# Patient Record
Sex: Female | Born: 1935 | Race: White | Hispanic: No | Marital: Married | Smoking: Never smoker
Health system: Southern US, Community
[De-identification: ages and names within clinical notes are randomized; demographics above are authoritative.]

## PROBLEM LIST (undated history)

## (undated) DIAGNOSIS — G20A1 Parkinson's disease without dyskinesia, without mention of fluctuations: Secondary | ICD-10-CM

## (undated) DIAGNOSIS — F32A Depression, unspecified: Secondary | ICD-10-CM

## (undated) DIAGNOSIS — G4730 Sleep apnea, unspecified: Principal | ICD-10-CM

## (undated) DIAGNOSIS — F329 Major depressive disorder, single episode, unspecified: Secondary | ICD-10-CM

## (undated) DIAGNOSIS — C4491 Basal cell carcinoma of skin, unspecified: Secondary | ICD-10-CM

## (undated) DIAGNOSIS — R7303 Prediabetes: Secondary | ICD-10-CM

## (undated) DIAGNOSIS — E119 Type 2 diabetes mellitus without complications: Secondary | ICD-10-CM

## (undated) DIAGNOSIS — E785 Hyperlipidemia, unspecified: Secondary | ICD-10-CM

## (undated) DIAGNOSIS — G479 Sleep disorder, unspecified: Secondary | ICD-10-CM

## (undated) DIAGNOSIS — F419 Anxiety disorder, unspecified: Secondary | ICD-10-CM

## (undated) DIAGNOSIS — G4700 Insomnia, unspecified: Secondary | ICD-10-CM

## (undated) DIAGNOSIS — I10 Essential (primary) hypertension: Secondary | ICD-10-CM

## (undated) DIAGNOSIS — G20 Parkinson's disease: Secondary | ICD-10-CM

## (undated) HISTORY — DX: Basal cell carcinoma of skin, unspecified: C44.91

## (undated) HISTORY — DX: Hyperlipidemia, unspecified: E78.5

## (undated) HISTORY — DX: Anxiety disorder, unspecified: F41.9

## (undated) HISTORY — DX: Parkinson's disease: G20

## (undated) HISTORY — DX: Major depressive disorder, single episode, unspecified: F32.9

## (undated) HISTORY — DX: Sleep disorder, unspecified: G47.9

## (undated) HISTORY — DX: Insomnia, unspecified: G47.00

## (undated) HISTORY — DX: Depression, unspecified: F32.A

## (undated) HISTORY — DX: Sleep apnea, unspecified: G47.30

## (undated) HISTORY — PX: CATARACT EXTRACTION: SUR2

## (undated) HISTORY — DX: Essential (primary) hypertension: I10

## (undated) HISTORY — DX: Parkinson's disease without dyskinesia, without mention of fluctuations: G20.A1

## (undated) HISTORY — DX: Prediabetes: R73.03

---

## 2003-12-31 ENCOUNTER — Ambulatory Visit: Payer: Self-pay | Admitting: Internal Medicine

## 2005-01-31 ENCOUNTER — Ambulatory Visit: Payer: Self-pay | Admitting: Internal Medicine

## 2006-02-14 ENCOUNTER — Ambulatory Visit: Payer: Self-pay | Admitting: Internal Medicine

## 2007-02-18 ENCOUNTER — Ambulatory Visit: Payer: Self-pay | Admitting: Internal Medicine

## 2007-03-04 ENCOUNTER — Emergency Department: Payer: Self-pay | Admitting: Internal Medicine

## 2008-02-19 ENCOUNTER — Ambulatory Visit: Payer: Self-pay | Admitting: Internal Medicine

## 2008-03-17 ENCOUNTER — Ambulatory Visit: Payer: Self-pay | Admitting: Unknown Physician Specialty

## 2008-03-20 ENCOUNTER — Ambulatory Visit: Payer: Self-pay | Admitting: Unknown Physician Specialty

## 2009-03-18 ENCOUNTER — Ambulatory Visit: Payer: Self-pay | Admitting: Internal Medicine

## 2009-03-26 ENCOUNTER — Ambulatory Visit: Payer: Self-pay | Admitting: Internal Medicine

## 2009-09-13 ENCOUNTER — Ambulatory Visit: Payer: Self-pay | Admitting: Ophthalmology

## 2009-09-20 ENCOUNTER — Ambulatory Visit: Payer: Self-pay | Admitting: Ophthalmology

## 2009-09-20 ENCOUNTER — Ambulatory Visit: Payer: Self-pay | Admitting: Cardiovascular Disease

## 2009-10-22 ENCOUNTER — Ambulatory Visit: Payer: Self-pay | Admitting: Ophthalmology

## 2009-11-08 ENCOUNTER — Ambulatory Visit: Payer: Self-pay | Admitting: Ophthalmology

## 2010-03-21 ENCOUNTER — Ambulatory Visit: Payer: Self-pay | Admitting: Internal Medicine

## 2012-06-10 ENCOUNTER — Ambulatory Visit: Payer: Self-pay | Admitting: Neurology

## 2012-07-01 ENCOUNTER — Telehealth: Payer: Self-pay | Admitting: Nurse Practitioner

## 2012-07-01 NOTE — Telephone Encounter (Signed)
Called patient to get more information. Patient broke a tendon. Dr.Nundley doing day surgery July 3rd . Dr.Nundley is wanting me to take Celebrex for five days before surg. Is this ok? With binging on Celebrex for five days will I have problems with parkinson's. Patient is going off Celebrex right after her surgery.  Called patient and spoke back to her. She is going to call her PCP about Celebrex . Eber Jones has not seen her since 2012. Patient wants to get sch.. To see Dr. Hosie Poisson. Early August because of her surgery. Patient understood.

## 2012-07-01 NOTE — Telephone Encounter (Signed)
Patient is calling to tell us she's having surgery on July 25, 2012.  She needs to be cleared for surgery.  She's asking if any of her meds need to be discontinued prior to surgery?  She would like a call back asap.  [phone]

## 2012-08-23 ENCOUNTER — Encounter: Payer: Self-pay | Admitting: Neurology

## 2012-08-23 DIAGNOSIS — G4700 Insomnia, unspecified: Secondary | ICD-10-CM

## 2012-08-23 DIAGNOSIS — G479 Sleep disorder, unspecified: Secondary | ICD-10-CM

## 2012-08-23 DIAGNOSIS — I10 Essential (primary) hypertension: Secondary | ICD-10-CM

## 2012-08-26 ENCOUNTER — Ambulatory Visit (INDEPENDENT_AMBULATORY_CARE_PROVIDER_SITE_OTHER): Payer: 59 | Admitting: Neurology

## 2012-08-26 ENCOUNTER — Encounter: Payer: Self-pay | Admitting: Neurology

## 2012-08-26 VITALS — BP 110/60 | HR 64 | Ht 67.0 in | Wt 176.0 lb

## 2012-08-26 DIAGNOSIS — G20 Parkinson's disease: Secondary | ICD-10-CM

## 2012-08-26 DIAGNOSIS — K5900 Constipation, unspecified: Secondary | ICD-10-CM

## 2012-08-26 NOTE — Progress Notes (Signed)
Provider:  Dr Hosie Poisson Referring Provider: No ref. provider found Primary Care Physician:  No primary provider on file.  No chief complaint on file.   HPI:  Tiffany Ramirez is a 79 y.o. female here as a follow up of her Parkinson's disease, with her last visit being 01/2012.  Overall, clinically doing well since her last visit. Denies any acute issues and reports PD is stable. Continues on Sinemet 25/100 three times a day, currently taking at 6:30 AM, 11:30 AM, 5:30 PM. Notes continued good benefit from this medication, feels that she can move better and easier while on it. Tremor is well controlled, continues to be R>L sided. Denies any wearing off symptoms with this medicine, last the whole 5 hours. No motor fluctuations noted. Does notice a questionable amount of small dyskinesias in her left foot, described as tapping movement sensation. Reports these symptoms occur most often at nighttime and are relieved with a muscle relaxant. Denies any muscle cramping, neck pain or sialorrhea.   Reports no difficulties with sleep, no REM behavior disorder noted. No hallucinations or vivid dreams. Typically will wake up once per night to urinate. Notes some difficulty with constipation, reports she does not hydrate well or take in a lot of fiber. Does not exercise much as she should. Denies any gait instability. No trips falls or close calls. No dizziness with standing.  Was recently diagnosed with diabetes  Per Prior note of Dr Sandria Manly from Jan 2014: 79y/o woman with PD, onset of R-handed tremor around 2009 that has gotten progressively worse. Started as rest tremor in R thumb but progressed to bilat UE. Notes stiffness, swelling in legs. Denies any cogntiive issues. Worse with stress/anxiety. Notes micrographia. Tried primidone and inderal for tremor but no improvement noted.   Concerns/Questions:Review of Systems: Out of a complete 14 system review, the patient complains of only the following symptoms, and  all other reviewed systems are negative. Positive for fatigue  History   Social History  . Marital Status: Married    Spouse Name: Tiffany Ramirez    Number of Children: 2  . Years of Education: N/A   Occupational History  .      Home maker   Social History Main Topics  . Smoking status: Never Smoker   . Smokeless tobacco: Never Used  . Alcohol Use: No  . Drug Use: No  . Sexually Active: Not on file   Other Topics Concern  . Not on file   Social History Narrative   Patient lives at home with her husband Tiffany Ramirez. Patient is a homemaker.   Right handed.   Three cups of coffee daily.    Family History  Problem Relation Age of Onset  . Heart failure Mother   . Heart attack Father   . Brain cancer Sister   . Psychiatric Illness Sister   . Stroke Sister   . Diabetes Brother     Past Medical History  Diagnosis Date  . Insomnia with sleep apnea, unspecified   . Parkinson's disease   . Sleep disorder   . Other and unspecified hyperlipidemia   . Unspecified essential hypertension     Past Surgical History  Procedure Laterality Date  . Cataract extraction Bilateral     Current Outpatient Prescriptions  Medication Sig Dispense Refill  . AMLODIPINE BESYLATE PO Take by mouth daily.      Marland Kitchen aspirin 81 MG tablet Take 81 mg by mouth daily.      Marland Kitchen CALCIUM ASCORBATE PO Take  by mouth daily.      . carbidopa-levodopa (SINEMET IR) 25-100 MG per tablet Take 1 tablet by mouth 3 (three) times daily.      . clopidogrel (PLAVIX) 75 MG tablet Take 75 mg by mouth daily.      . cyclobenzaprine (FLEXERIL) 10 MG tablet Take 10 mg by mouth 3 (three) times daily as needed for muscle spasms.      . pantoprazole (PROTONIX) 20 MG tablet Take 20 mg by mouth daily.      . propranolol (INDERAL) 80 MG tablet Take 80 mg by mouth 3 (three) times daily.      Marland Kitchen SIMVASTATIN PO Take by mouth daily.       No current facility-administered medications for this visit.    Allergies as of 08/26/2012  . (No Known  Allergies)    Vitals: There were no vitals taken for this visit. Last Weight:  Wt Readings from Last 1 Encounters:  No data found for Wt   Last Height:   Ht Readings from Last 1 Encounters:  No data found for Ht     Physical exam: Exam: Gen: NAD, conversant Eyes: anicteric sclerae, moist conjunctivae HENT: Atraumatic Lungs: CTA, no wheezing, rales, rhonic                          CV: RRR, no MRG Abdomen: Soft, non-tender;  Extremities: No peripheral edema  Skin: Normal temperature, no rash,  Psych: Appropriate affect, pleasant  Neuro: MS: AA&Ox3, appropriately interactive, normal affect   Speech: fluent w/o paraphasic error  Memory: good recent and remote recall  CN: PERRL, EOMI no nystagmus, no ptosis, sensation intact to LT V1-V3 bilat, face symmetric, no weakness, hearing grossly intact, shoulder shrug 5/5 bilat,  tongue protrudes midline, no fasiculations noted.  Motor: normal bulk and tone Strength: 5/5  In all extremities  Reflexes: symmetrical, bilat downgoing toes  Sens: LT intact in all extremities  Brief Motor UPDRS  Speech: mild hypophonia  Facial Expression: Masked facies Tremor: Rest R: 1 L: 1  Action/postural R: 1 L: 0  Rigidity: RUE: 1 LUE: 0  Finger taps:   R: 2 L: 1  Open/close hands: R: 2 L: 1  Foot taps: R:1 L:1  Arising from Chair: 1, arises on own but slightly slow Gait/FOG: 1: upright, no shuffling noted, decreased arm swing bilat R>L, turns without difficulty, one step back with Pull test but catches self.    Assessment:  After physical and neurologic examination, review of laboratory studies, imaging, neurophysiology testing and pre-existing records, assessment will be reviewed on the problem list.  Plan:  Treatment plan and additional workup will be reviewed under Problem List.  [name] is a pleasant 79y/o woman with diagnosis of parkinsonism, which based on age, symptoms and progression is most  consistent with diagnosis of idiopathic-PD. Presents for return follow up and appears to be doing clinically well. Denies any acute issues, tolerating medications well. Denies any motor fluctuations or were not Sinemet. Appears to be getting full 5 hour benefit between doses. Does note difficulty with constipation. Patient does note some abnormal overnight movements of her lower extremity which she attributes to being dyskinesias. Question of fistula or dyskinesias based on the time they're occurring, and the response to a muscle relaxant. Suspect these may actually be muscle spasms related to wearing off medication.  #1 Parkinson's disease #2 constipation  -Will continue Sinemet 25 100 three times a day at current dosing  schedule of 6:30am, 11:30am, 5:30 PM -If patient develops wearing off or motor fluctuations would consider addition of Azilect and/or additional dose of Sinemet in the future -Patient counseled on importance of hydrating well in increasing fiber intake to prevent constipation, counseled this will help absorption of Sinemet  -Patient counseled on importance of exercise in the treatment of Parkinson's  A total of 25 minutes was spent with this patient. Over half the time was spent in direct face-to-face consultation with patient and her husband. We discussed the current diagnosis, proper way to take Sinemet, the importance of avoiding protein with the medication. Discussed importance of exercise and treatment of Parkinson's. All questions were fully answered

## 2012-08-26 NOTE — Patient Instructions (Addendum)
Overall you are doing fairly well but I do want to suggest a few things today:   Remember to drink plenty of fluid, eat healthy meals and do not skip any meals. Try to eat protein with a every meal and eat a healthy snack such as fruit or nuts in between meals. Try to keep a regular sleep-wake schedule and try to exercise daily, particularly in the form of walking, 20-30 minutes a day, if you can.   As far as your medications are concerned, I would like to suggest continuing your Sinemet at its current dose of three times a day. I suggest hydrating well and trying to increase your fiber intake. This will help your constipation.  Please schedule a follow up with NP Darrol Angel for 1 year, sooner if we need to. Please call us with any interim questions, concerns, problems, updates or refill requests.   My clinical assistant and will answer any of your questions and relay your messages to me and also relay most of my messages to you.   Our phone number is [phone]. We also have an after hours call service for urgent matters and there is a physician on-call for urgent questions. For any emergencies you know to call 911 or go to the nearest emergency room

## 2013-07-28 DIAGNOSIS — E782 Mixed hyperlipidemia: Secondary | ICD-10-CM | POA: Insufficient documentation

## 2013-07-28 DIAGNOSIS — E785 Hyperlipidemia, unspecified: Secondary | ICD-10-CM | POA: Insufficient documentation

## 2013-07-28 DIAGNOSIS — I7090 Unspecified atherosclerosis: Secondary | ICD-10-CM | POA: Insufficient documentation

## 2013-08-28 ENCOUNTER — Ambulatory Visit (INDEPENDENT_AMBULATORY_CARE_PROVIDER_SITE_OTHER): Payer: 59 | Admitting: Nurse Practitioner

## 2013-08-28 ENCOUNTER — Encounter: Payer: Self-pay | Admitting: Nurse Practitioner

## 2013-08-28 VITALS — BP 135/66 | HR 57 | Ht 67.0 in | Wt 164.0 lb

## 2013-08-28 DIAGNOSIS — G20 Parkinson's disease: Secondary | ICD-10-CM | POA: Insufficient documentation

## 2013-08-28 DIAGNOSIS — G20A1 Parkinson's disease without dyskinesia, without mention of fluctuations: Secondary | ICD-10-CM

## 2013-08-28 NOTE — Patient Instructions (Signed)
Continue Sinemet 3 times a day, symptoms are well controlled Stay well hydrated, moderate exercise by  Walking, several times a week Follow up yearly and when necessary

## 2013-08-28 NOTE — Progress Notes (Signed)
[facility]  PATIENT: Tiffany Ramirez DOB: [birth year]   REASON FOR VISIT:follow up for parkinson's disease   HISTORY OF PRESENT ILLNESS: Ms. Traub, 80 year old female returns for followup. She was last seen by Dr. Janann Colonel 08/26/2012 for Parkinson's disease. She remains on Sinemet 3 times a day with good benefit from the medication and no side effects. She denies that she has had any falls since last seen. She is getting little exercise. Her tremor is well controlled. She is not having wearing off symptoms or  motor fluctuations. She denies any problems with her memory. She denies any problems with speech or swallowing. She returns for reevaluation.  HISTORY: Tiffany Ramirez is a 80 y.o. female here as a follow up of her Parkinson's disease, with her last visit being 01/2012.  Overall, clinically doing well since her last visit. Denies any acute issues and reports PD is stable. Continues on Sinemet 25/100 three times a day, currently taking at 6:30 AM, 11:30 AM, 5:30 PM. Notes continued good benefit from this medication, feels that she can move better and easier while on it. Tremor is well controlled, continues to be R>L sided. Denies any wearing off symptoms with this medicine, last the whole 5 hours. No motor fluctuations noted. Does notice a questionable amount of small dyskinesias in her left foot, described as tapping movement sensation. Reports these symptoms occur most often at nighttime and are relieved with a muscle relaxant. Denies any muscle cramping, neck pain or sialorrhea.  Reports no difficulties with sleep, no REM behavior disorder noted. No hallucinations or vivid dreams. Typically will wake up once per night to urinate. Notes some difficulty with constipation, reports she does not hydrate well or take in a lot of fiber. Does not exercise much as she should. Denies any gait instability. No trips falls or close calls. No dizziness with standing. Was recently  diagnosed with diabetes  Per Prior note of Dr Erling Cruz from Jan 2014:  80y/o woman with PD, onset of R-handed tremor around 2009 that has gotten progressively worse. Started as rest tremor in R thumb but progressed to bilat UE. Notes stiffness, swelling in legs. Denies any cogntiive issues. Worse with stress/anxiety. Notes micrographia. Tried primidone and inderal for tremor but no improvement noted.    REVIEW OF SYSTEMS: Full 14 system review of systems performed and notable only for those listed, all others are neg:  Constitutional: N/A  Cardiovascular: N/A  Ear/Nose/Throat: Runny nose  Skin: N/A  Eyes: N/A  Respiratory: N/A  Gastroitestinal: N/A  Hematology/Lymphatic: N/A  Endocrine: N/A Musculoskeletal:N/A  Allergy/Immunology: N/A  Neurological: Tremors Psychiatric: N/A Sleep : NA   ALLERGIES: Allergies  Allergen Reactions  . Phenobarbital Other (See Comments)    Overall restlessness    HOME MEDICATIONS: Outpatient Prescriptions Prior to Visit  Medication Sig Dispense Refill  . aspirin 81 MG tablet Take 81 mg by mouth daily.      Marland Kitchen CALCIUM ASCORBATE PO Take 600 mg by mouth daily.       . carbidopa-levodopa (SINEMET IR) 25-100 MG per tablet Take 1 tablet by mouth 3 (three) times daily.      . clopidogrel (PLAVIX) 75 MG tablet Take 75 mg by mouth daily.      . cyclobenzaprine (FLEXERIL) 10 MG tablet Take 10 mg by mouth at bedtime as needed for muscle spasms.       . pantoprazole (PROTONIX) 20 MG tablet Take 20 mg by mouth daily.      . propranolol (  INDERAL) 80 MG tablet Take 80 mg by mouth daily.       Marland Kitchen SIMVASTATIN PO Take 40 mg by mouth daily.       Marland Kitchen AMLODIPINE BESYLATE PO Take by mouth daily.       No facility-administered medications prior to visit.    PAST MEDICAL HISTORY: Past Medical History  Diagnosis Date  . Insomnia with sleep apnea, unspecified   . Parkinson's disease   . Sleep disorder   . Other and unspecified hyperlipidemia   . Unspecified essential  hypertension     PAST SURGICAL HISTORY: Past Surgical History  Procedure Laterality Date  . Cataract extraction Bilateral     FAMILY HISTORY: Family History  Problem Relation Age of Onset  . Heart failure Mother   . Heart attack Father   . Brain cancer Sister   . Psychiatric Illness Sister   . Stroke Sister   . Diabetes Brother     SOCIAL HISTORY: History   Social History  . Marital Status: Married    Spouse Name: Joe    Number of Children: 2  . Years of Education: N/A   Occupational History  .      Home maker   Social History Main Topics  . Smoking status: Never Smoker   . Smokeless tobacco: Never Used  . Alcohol Use: No  . Drug Use: No  . Sexual Activity: Not on file   Other Topics Concern  . Not on file   Social History Narrative   Patient lives at home with her husband [name]. Patient is a homemaker.   Right handed.   Three cups of coffee daily.     PHYSICAL EXAM  Filed Vitals:   08/28/13 1331  BP: 135/66  Pulse: 57  Height: 5\' 7"  (1.702 m)  Weight: 164 lb (74.39 kg)   Body mass index is 25.68 kg/(m^2).  Generalized: Well developed, in no acute distress, mild masking of the face  Head: normocephalic and atraumatic,. Oropharynx benign  Neck: Supple, no carotid bruits  Cardiac: Regular rate rhythm, no murmur  Musculoskeletal: No deformity   Neurological examination   Mentation: Alert oriented to time, place, history taking. Follows all commands speech and language fluent, mild hypophonia  Cranial nerve II-XII: Pupils were equal round reactive to light extraocular movements were full, visual field were full on confrontational test. Facial sensation and strength were normal. hearing was intact to finger rubbing bilaterally. Uvula tongue midline. head turning and shoulder shrug were normal and symmetric.Tongue protrusion into cheek strength was normal.Negative Myerson's sign Motor: normal bulk and tone, full strength in the BUE, BLE, fine finger  movements normal, no pronator drift. No focal weakness, no resting tremor Sensory: normal and symmetric to light touch, pinprick, and  vibration  Coordination: finger-nose-finger, heel-to-shin bilaterally, no dysmetria Reflexes: Brachioradialis 2/2, biceps 2/2, triceps 2/2, patellar 2/2, Achilles 2/2, plantar responses were flexor bilaterally. Gait and Station: Rising up from seated position without assistance of arms  normal stance,  moderate stride, decreased arm swing right greater than left  smooth turning, able to perform tiptoe, and heel walking without difficulty. Tandem gait is mildly unsteady  DIAGNOSTIC DATA (LABS, IMAGING, TESTING) - ASSESSMENT AND PLAN  80 y.o. year old female  has a past medical history of Parkinson's disease; here to followup. She is a patient of Dr. Janann Colonel who is out of the office today  Continue Sinemet 3 times a day, symptoms are well controlled does not need refills Stay well hydrated, moderate  exercise by  walking, several times a week Follow up yearly and when necessary Dennie Bible, [facility], [facility], [facility] [address], [place], [place] [address] [phone]

## 2013-09-11 NOTE — Progress Notes (Signed)
I reviewed note and agree with plan.   Aleida Crandell R. Gwenn Teodoro, MD  Certified in Neurology, Neurophysiology and Neuroimaging  [facility] [address], [address] [place], [place] [address] [phone]   

## 2014-02-17 ENCOUNTER — Ambulatory Visit: Payer: Self-pay | Admitting: Internal Medicine

## 2014-08-13 DIAGNOSIS — R739 Hyperglycemia, unspecified: Secondary | ICD-10-CM | POA: Insufficient documentation

## 2014-08-31 ENCOUNTER — Ambulatory Visit: Payer: 59 | Admitting: Nurse Practitioner

## 2014-09-21 ENCOUNTER — Ambulatory Visit (INDEPENDENT_AMBULATORY_CARE_PROVIDER_SITE_OTHER): Payer: Medicare Other | Admitting: Nurse Practitioner

## 2014-09-21 ENCOUNTER — Encounter: Payer: Self-pay | Admitting: Nurse Practitioner

## 2014-09-21 VITALS — BP 130/70 | HR 56 | Ht 67.0 in | Wt 166.5 lb

## 2014-09-21 DIAGNOSIS — G20 Parkinson's disease: Secondary | ICD-10-CM

## 2014-09-21 DIAGNOSIS — G4700 Insomnia, unspecified: Secondary | ICD-10-CM | POA: Diagnosis not present

## 2014-09-21 DIAGNOSIS — G479 Sleep disorder, unspecified: Secondary | ICD-10-CM

## 2014-09-21 DIAGNOSIS — G4730 Sleep apnea, unspecified: Secondary | ICD-10-CM

## 2014-09-21 DIAGNOSIS — I10 Essential (primary) hypertension: Secondary | ICD-10-CM | POA: Diagnosis not present

## 2014-09-21 MED ORDER — CYCLOBENZAPRINE HCL 10 MG PO TABS
10.0000 mg | ORAL_TABLET | Freq: Every evening | ORAL | Status: DC | PRN
Start: 2014-09-21 — End: 2015-02-22

## 2014-09-21 NOTE — Progress Notes (Signed)
[facility]  PATIENT: Tiffany Ramirez DOB: 1928-04-30   REASON FOR VISIT:  Follow-up for Parkinson's disease, sleep disorder, insomnia HISTORY FROM: Patient    HISTORY OF PRESENT ILLNESS:Tiffany Ramirez, 81 year old female returns for followup. She has a history of  Parkinson's disease. She remains on Sinemet 3 times a day with good benefit from the medication and no side effects. She denies that she has had any falls since last seen. She is getting little exercise. Her tremor is well controlled. She is not having wearing off symptoms or motor fluctuations. She denies any problems with her memory. She denies any problems with speech or swallowing. She returns for reevaluation.  HISTORY: Tiffany Ramirez is a 81 y.o. female here as a follow up of her Parkinson's disease, with her last visit being 01/2012.  Overall, clinically doing well since her last visit. Denies any acute issues and reports PD is stable. Continues on Sinemet 25/100 three times a day, currently taking at 6:30 AM, 11:30 AM, 5:30 PM. Notes continued good benefit from this medication, feels that she can move better and easier while on it. Tremor is well controlled, continues to be R>L sided. Denies any wearing off symptoms with this medicine, last the whole 5 hours. No motor fluctuations noted. Does notice a questionable amount of small dyskinesias in her left foot, described as tapping movement sensation. Reports these symptoms occur most often at nighttime and are relieved with a muscle relaxant. Denies any muscle cramping, neck pain or sialorrhea.  Reports no difficulties with sleep, no REM behavior disorder noted. No hallucinations or vivid dreams. Typically will wake up once per night to urinate. Notes some difficulty with constipation, reports she does not hydrate well or take in a lot of fiber. Does not exercise much as she should. Denies any gait instability. No trips falls or close calls. No dizziness  with standing. Was recently diagnosed with diabetes  Per Prior note of Dr Erling Cruz from Jan 2014:  81y/o woman with PD, onset of R-handed tremor around 2009 that has gotten progressively worse. Started as rest tremor in R thumb but progressed to bilat UE. Notes stiffness, swelling in legs. Denies any cogntiive issues. Worse with stress/anxiety. Notes micrographia. Tried primidone and inderal for tremor but no improvement noted.     REVIEW OF SYSTEMS: Full 14 system review of systems performed and notable only for those listed, all others are neg:  Constitutional: neg  Cardiovascular: neg Ear/Nose/Throat: neg  Skin: neg Eyes: neg Respiratory: neg Gastroitestinal: neg  Hematology/Lymphatic: neg  Endocrine: neg Musculoskeletal:neg Allergy/Immunology: neg Neurological: Tremor Psychiatric: neg Sleep : Insomnia   ALLERGIES: Allergies  Allergen Reactions  . Phenobarbital Other (See Comments)    Overall restlessness    HOME MEDICATIONS: Outpatient Prescriptions Prior to Visit  Medication Sig Dispense Refill  . aspirin 81 MG tablet Take 81 mg by mouth daily.    Marland Kitchen CALCIUM ASCORBATE PO Take 600 mg by mouth daily.     . carbidopa-levodopa (SINEMET IR) 25-100 MG per tablet Take 1 tablet by mouth 3 (three) times daily.    . clopidogrel (PLAVIX) 75 MG tablet Take 75 mg by mouth daily.    . hydrochlorothiazide (HYDRODIURIL) 12.5 MG tablet Take 25 mg by mouth daily.    . propranolol (INDERAL) 80 MG tablet Take 80 mg by mouth daily.     Marland Kitchen SIMVASTATIN PO Take 40 mg by mouth daily.     . cyclobenzaprine (FLEXERIL) 10 MG tablet Take 10 mg by  mouth at bedtime as needed for muscle spasms.     . pantoprazole (PROTONIX) 20 MG tablet Take 40 mg by mouth daily.      No facility-administered medications prior to visit.    PAST MEDICAL HISTORY: Past Medical History  Diagnosis Date  . Insomnia with sleep apnea, unspecified   . Parkinson's disease   . Sleep disorder   . Other and unspecified  hyperlipidemia   . Unspecified essential hypertension     PAST SURGICAL HISTORY: Past Surgical History  Procedure Laterality Date  . Cataract extraction Bilateral     FAMILY HISTORY: Family History  Problem Relation Age of Onset  . Heart failure Mother   . Heart attack Father   . Brain cancer Sister   . Psychiatric Illness Sister   . Stroke Sister   . Diabetes Brother     SOCIAL HISTORY: Social History   Social History  . Marital Status: Married    Spouse Name: [name]  . Number of Children: 2  . Years of Education: N/A   Occupational History  .      Home maker   Social History Main Topics  . Smoking status: Never Smoker   . Smokeless tobacco: Never Used  . Alcohol Use: No  . Drug Use: No  . Sexual Activity: Not on file   Other Topics Concern  . Not on file   Social History Narrative   Patient lives at home with her husband [name]. Patient is a homemaker.   Right handed.   Three cups of coffee daily.     PHYSICAL EXAM  Filed Vitals:   09/21/14 1612  BP: 160/76  Pulse: 56  Height: 5\' 7"  (1.702 m)  Weight: 166 lb 8 oz (75.524 kg)   Body mass index is 26.07 kg/(m^2). Generalized: Well developed, in no acute distress, mild masking of the face  Head: normocephalic and atraumatic,. Oropharynx benign  Neck: Supple, no carotid bruits  Cardiac: Regular rate rhythm, no murmur  Musculoskeletal: No deformity   Neurological examination   Mentation: Alert oriented to time, place, history taking. Follows all commands speech and language fluent, mild hypophonia  Cranial nerve II-XII: Pupils were equal round reactive to light extraocular movements were full, visual field were full on confrontational test. Facial sensation and strength were normal. hearing was intact to finger rubbing bilaterally. Uvula tongue midline. head turning and shoulder shrug were normal and symmetric.Tongue protrusion into cheek strength was normal.Negative Myerson's sign Motor: normal bulk  and tone, full strength in the BUE, BLE, fine finger movements normal, no pronator drift. No focal weakness, no resting tremor Sensory: normal and symmetric to light touch, pinprick, and vibration  Coordination: finger-nose-finger, heel-to-shin bilaterally, no dysmetria Reflexes: Brachioradialis 2/2, biceps 2/2, triceps 2/2, patellar 2/2, Achilles 2/2, plantar responses were flexor bilaterally. Gait and Station: Rising up from seated position without assistance of arms normal stance, moderate stride, decreased arm swing right greater than left smooth turning, able to perform tiptoe, and heel walking without difficulty. Tandem gait is mildly unsteady . No assistive device  DIAGNOSTIC DATA (LABS, IMAGING, TESTING) - ASSESSMENT AND PLAN  81 y.o. year old female  has a past medical history of Insomnia with sleep apnea, unspecified; Parkinson's disease; Sleep disorder; here to follow-up for her Parkinson's disease and insomnia.  Continue Sinemet 3 times a day, symptoms are well controlled does not need refills Continue Flexeril will refill, this was ordered for her insomnia after her sleep study Stay well hydrated, moderate exercise by  walking, several times a week Follow up yearly and when necessary Tiffany Ramirez, [facility], [facility], APRN  [facility] [address], [place], [place] [address] [phone]

## 2014-09-21 NOTE — Patient Instructions (Signed)
Continue Sinemet 3 times a day, symptoms are well controlled does not need refills Continue Flexeril will refill Stay well hydrated, moderate exercise by walking, several times a week Follow up yearly and when necessary

## 2014-09-22 NOTE — Progress Notes (Signed)
I reviewed note and agree with plan.   Penni Bombard, MD 7/42/5956, 3:87 AM Certified in Neurology, Neurophysiology and Neuroimaging  [facility] [address], [place], [place] [address] [phone]

## 2014-11-16 ENCOUNTER — Telehealth: Payer: Self-pay | Admitting: Nurse Practitioner

## 2014-11-16 NOTE — Telephone Encounter (Signed)
Pt's daughter called sts pt is having problems with depression and increased shaking and PTSD. She started taking Paxil around the 2nd week of Aug 2016, depression got better but shaking got worse. She stopped taking PARoxetine (PAXIL) 10 MG tablet  After last OV 09/21/14, she thought it increased shaking.  Daughter sts pt is under a lot of stress and has noticed that with increased stress the shaking gets worse. Both pt's daughter's have come to from out of town take care of pt in an emergency situation,pt was saying she did not want to live anymore. Daughter is wanting to take her to [place] (where she lives)to take care of her. Please call daughter at [phone].

## 2014-11-16 NOTE — Telephone Encounter (Signed)
I spoke to daughter, [name] who is on [place].    Pt was started on an antidepressant, paxil and took for 2 wks but caused increase tremors.  She did speak to pcp, but due to miscommunication or ?  She decided to go off the paxil.  She is back to baseline of her PD, but she is very depressed.  Daughter is asking for recommendations , if there is any medication recommendation for an antidepressant that would be better for PD pts.  [name] will be taking both parents with her back to [place] with her for a period of time.  Since her parents will be coming back here to live (once she feels like pt is doing ok) she does not want to change MD's if at all possible.  (concerning the depression issue).   I told her that this was not our speciality, but would ask.  She has not seen a MD here since Dr. Janann Colonel (but NP has been overseen by Dr. Leta Baptist) who is assigned to pt).  They are leaving tomorrow would be willing to come in.

## 2014-11-16 NOTE — Telephone Encounter (Signed)
Since PCP has ordered this in the past I would prefer he take care of this

## 2014-11-16 NOTE — Telephone Encounter (Signed)
I called and spoke to Tiffany Ramirez again and let her know that I spoke in general to another movement disorder MD about what antidepressants would be better prescribed for PD pts.  Relayed that all of these may cause increase tremors but possible options  are celexa, effexor or lexapro.  Pcp would need to decide and prescribe.  I told her that unfortunately that I did not have any availability with MD tomorrow  but Wednesday I did.   She then stated that they would like to come and see PD specialist.  I was able to fit in with Dr. Rexene Alberts for this Wednesday at 1000 (be here 0945).  She verbalized understanding.  She had placed call to pcp and was waiting for call back.

## 2014-11-18 ENCOUNTER — Ambulatory Visit (INDEPENDENT_AMBULATORY_CARE_PROVIDER_SITE_OTHER): Payer: Medicare Other | Admitting: Neurology

## 2014-11-18 ENCOUNTER — Encounter: Payer: Self-pay | Admitting: Neurology

## 2014-11-18 VITALS — BP 110/60 | HR 72 | Resp 16 | Ht 67.0 in | Wt 168.0 lb

## 2014-11-18 DIAGNOSIS — K5900 Constipation, unspecified: Secondary | ICD-10-CM | POA: Diagnosis not present

## 2014-11-18 DIAGNOSIS — K5909 Other constipation: Secondary | ICD-10-CM

## 2014-11-18 DIAGNOSIS — F418 Other specified anxiety disorders: Secondary | ICD-10-CM

## 2014-11-18 DIAGNOSIS — G20 Parkinson's disease: Secondary | ICD-10-CM

## 2014-11-18 MED ORDER — CARBIDOPA-LEVODOPA 25-100 MG PO TABS: 1.0000 | ORAL_TABLET | Freq: Three times a day (TID) | ORAL | Status: DC

## 2014-11-18 MED ORDER — CARBIDOPA-LEVODOPA ER 50-200 MG PO TBCR: 1.0000 | EXTENDED_RELEASE_TABLET | Freq: Every day | ORAL | Status: DC

## 2014-11-18 NOTE — Patient Instructions (Addendum)
I think, from what I can tell, motor-wise your Parkinson's disease has remained fairly stable, which is reassuring. Nevertheless, as you know, this disease does progress with time. It can affect your balance, your memory, your mood, your bowel and bladder function, your posture, balance and walking and your activities of daily living. However, there are good supportive treatments and symptomatic treatments available, so most patients have a change to a good quality life and life expectancy is not typically altered. Overall you are doing fairly well but I do want to suggest a few things today:  Remember to drink plenty of fluid at least 6 glasses (8 oz each), eat healthy meals and do not skip any meals. Try to eat protein with a every meal and eat a healthy snack such as fruit or nuts in between meals. Try to keep a regular sleep-wake schedule and try to exercise daily, particularly in the form of walking, 20-30 minutes a day, if you can.   Taking your medication on schedule is key.   Try to stay active physically and mentally. Engage in social activities in your community and with your family and try to keep up with current events by reading the newspaper or watching the news. Try to do word puzzles and you may like to do puzzles and brain games on the computer such as on [email or web address]   As far as your medications are concerned, I would like to suggest that you take your current medication with the following additional changes: stop Wellbutrin, and start Effexor XR 37.5 mg. We will keep your sinemet the same, but ADD Sinemet CR 50/200 mg at bedtime, and we will stop the night time flexeril.  You can try Melatonin at night for sleep: take 3 to 5 mg one hour before your bedtime.   I would like to see you back in 3 months, sooner if we need to. Please call us with any interim questions, concerns, problems, updates or refill requests.  Our phone number is [phone]. We also have an after hours call  service for urgent matters and there is a physician on-call for urgent questions, that cannot wait till the next work day. For any emergencies you know to call 911 or go to the nearest emergency room.   You can email me through my chart and also leave a phone message for Beverlee Nims, my nurse.

## 2014-11-18 NOTE — Progress Notes (Signed)
Subjective:    Patient ID: Tiffany Ramirez is a 81 y.o. female.  HPI     Interim history:    Tiffany Ramirez is a very pleasant 81 year old right-handed woman with an underlying medical history of TIA, hypertension, hyperlipidemia, and overweight state, history of complex sleep apnea and PLMD, with a remote history of restless leg syndrome, who presents for followup consultation of her Parkinson's disease. She's accompanied by her oldest daughter, Tiffany Ramirez, today. This is her first visit with me and she previously followed with Tiffany Ramirez and prior to that with Tiffany Ramirez. She was seen by Tiffany Ramirez on 08/26/2012, at which time she was doing well. She was taking Sinemet 3 times a day, at 6:30 AM, 11:30 AM and 5:30 PM. She had right-sided more than left-sided symptoms, tremor predominant. She had intermittent possible dyskinesias of her left foot. She had some constipation.  She was seen recently by Tiffany Ramirez, nurse practitioner on 09/21/2014, at which time she was kept on her medication regimen.  Today, 11/18/2014: She reports doing okay, per Tiffany Ramirez, she has had more depression and anxiety, she has had more stress. She feels that there is more stress at home. The patient's husband, Tiffany Ramirez, is 52 years old. They are planning to visit Tiffany Ramirez and stay with her for the next few weeks. She is no longer on Paxil. Her primary care physician started her on Wellbutrin which she has taken for the past 2 days. He has called in a prescription for Effexor XR instead of the Wellbutrin. She has not picked that prescription yet. When she was on Paxil she noted an increase in her tremors which is why she stopped it. Mood wise it was helpful. Symptoms of Parkinson's date back to about 5+ years ago when she started noticing an intermittent right hand tremor. She has gradually progressed. She has had issues with constipation. She takes MiraLAX for this. She does not excise regularly but used to. She does  not drink enough water, averaging about 3 glasses per day.   Previously:   Tiffany Ramirez (02/12/12): He felt she was doing very well. She was to resume an exercise program and did not have any medication changes at the time. Her falls assessment tool score at the time was 8. She has an underlying medical history of stroke, hypertension, hyperlipidemia. She is currently on Flexeril as needed, Inderal LA 80 mg daily, amlodipine-benazepril, slow FE, calcium vitamin D, baby aspirin, Protonix, Plavix, simvastatin, Sinemet 25/100 mg strength one tablet 3 times a day.  I reviewed Dr. Tressia Ramirez prior notes and the patient's records and below is a summary of that review:  81 year old right-handed woman who started noticing a hand tremor some 6 years ago and was first evaluated by Tiffany Ramirez on 02/01/2009. This tremor started in her right thumb. Once this test is tremor. She has been on Inderal for 5 years in my sleep for 2 years she denies memory loss, hallucinations, delusions, or depression. She has sleep study due to sleepiness reported. This was done in October 2013 and showed mild. A clip movements of sleep and mild sleep apnea. She was unable to tolerate CPAP. In January her MMSE was 29, clock drawing was 4, animal fluency was 20.  Her Past Medical History Is Significant For: Past Medical History  Diagnosis Date  . Insomnia with sleep apnea, unspecified   . Sleep disorder   . Other and unspecified hyperlipidemia   . Unspecified essential hypertension   .  Borderline diabetic     08-2014  . Depression   . Anxiety   . Parkinson's disease [facility])     Her Past Surgical History Is Significant For: Past Surgical History  Procedure Laterality Date  . Cataract extraction Bilateral     Her Family History Is Significant For: Family History  Problem Relation Age of Onset  . Heart failure Mother   . Heart attack Father   . Brain cancer Sister   . Psychiatric Illness Sister   . Stroke Sister   .  Diabetes Brother     Her Social History Is Significant For: Social History   Social History  . Marital Status: Married    Spouse Name: Tiffany Ramirez  . Number of Children: 2  . Years of Education: College   Occupational History  . Retired     Materials engineer   Social History Main Topics  . Smoking status: Never Smoker   . Smokeless tobacco: Never Used  . Alcohol Use: No  . Drug Use: No  . Sexual Activity: Not Asked   Other Topics Concern  . None   Social History Narrative   Patient lives at home with her husband Tiffany Ramirez. Patient is a homemaker.   Right handed.   One cup of coffee daily.    Her Allergies Are:  Allergies  Allergen Reactions  . Phenobarbital Other (See Comments)    Overall restlessness  :   Her Current Medications Are:  Outpatient Encounter Prescriptions as of 11/18/2014  Medication Sig  . aspirin 81 MG tablet Take 81 mg by mouth daily.  Marland Kitchen buPROPion (WELLBUTRIN XL) 150 MG 24 hr tablet   . CALCIUM ASCORBATE PO Take 600 mg by mouth daily.   . carbidopa-levodopa (SINEMET IR) 25-100 MG tablet Take 1 tablet by mouth 3 (three) times daily.  . clopidogrel (PLAVIX) 75 MG tablet Take 75 mg by mouth daily.  . cyclobenzaprine (FLEXERIL) 10 MG tablet Take 1 tablet (10 mg total) by mouth at bedtime as needed for muscle spasms.  . Ferrous Sulfate (SLOW FE PO) Take 1 tablet by mouth.  . hydrochlorothiazide (HYDRODIURIL) 12.5 MG tablet Take 25 mg by mouth daily.  . pantoprazole (PROTONIX) 20 MG tablet Take 40 mg by mouth daily.   . propranolol (INDERAL) 80 MG tablet Take 80 mg by mouth daily.   . Red Yeast Rice Extract (RED YEAST RICE PO) Take 1 capsule by mouth daily.  Marland Kitchen SIMVASTATIN PO Take 20 mg by mouth daily.   . [DISCONTINUED] carbidopa-levodopa (SINEMET IR) 25-100 MG per tablet Take 1 tablet by mouth 3 (three) times daily.  . [DISCONTINUED] PARoxetine (PAXIL) 10 MG tablet Take 10 mg by mouth daily.  . carbidopa-levodopa (SINEMET CR) 50-200 MG tablet Take 1 tablet by mouth at  bedtime.  Marland Kitchen venlafaxine (EFFEXOR) 37.5 MG tablet Take 37.5 mg by mouth 2 (two) times daily.   No facility-administered encounter medications on file as of 11/18/2014.  :  Review of Systems:  Out of a complete 14 point review of systems, all are reviewed and negative with the exception of these symptoms as listed below:   Review of Systems  Endocrine: Positive for polydipsia.  Neurological: Positive for tremors and numbness.       Patient would Ramirez to discuss and go over her medications, especially her medications for anxiety.  Insomnia, sleepiness, snoring, restless legs.   Hematological: Bruises/bleeds easily.  Psychiatric/Behavioral:       Depression, anxiety, not enough sleep, decreased energy, suicidal thoughts  Objective:  Neurologic Exam  Physical Exam Physical Examination:   Filed Vitals:   11/18/14 0948  BP: 110/60  Pulse: 72  Resp: 16    General Examination: The patient is a very pleasant 81 y.o. female in no acute distress. She is very well dressed. She is in good spirits today.   HEENT: Normocephalic, atraumatic, pupils are equal, round and reactive to light and accommodation. Funduscopic exam is normal with sharp disc margins noted. Extraocular tracking shows mild to moderate saccadic breakdown. She has no nystagmus. She has a mild decrease in eye blink rate and mild facial masking. Hearing is intact. Speech is mildly hypophonic. She has an intermittent lower lip and jaw tremor. Neck is mildly rigid. Oropharynx exam shows mild to moderate mouth dryness.   Chest: is clear to auscultation without wheezing, rhonchi or crackles noted.  Heart: sounds are regular and normal without murmurs, rubs or gallops noted.   Abdomen: is soft, non-tender and non-distended with normal bowel sounds appreciated on auscultation.  Extremities: There is trace pitting edema in the distal lower extremities bilaterally. Pedal pulses are intact.   Skin: is warm and dry with no trophic  changes noted. Age-related changes are noted on the skin.   Musculoskeletal: exam reveals no obvious joint deformities, tenderness, joint swelling or erythema.  Neurologically:  Mental status: The patient is awake and alert, paying good  attention. She is able to partially provide the history. Her daughter provides details.  The patient's memory, attention, language and knowledge seemed well preserved.  She may have mild bradyphrenia.   Cranial nerves are as described above under HEENT exam. In addition, shoulder shrug is normal with equal shoulder height noted.  Motor exam: Normal bulk, and strength for age is noted. There are mild intermittent  right hand dyskinesias noted. Otherwise she has no dyskinesias. She has an intermittent mild to at times moderate resting tremor in the right upper extremity. She has no overt tremor otherwise. Fine motor skills are moderately impaired with finger taps, hand movements and rapid alternating patting in the right upper extremity and mild to moderately impaired in the left upper extremity. Foot agility and foot taps are moderately impaired on the right and mildly so on the left. She stands up with mild difficulty but does not have to push herself up. Her posture is mildly stooped. She has a slight tilt to the right. She walks with decreased stride length and decreased pace and decreased arm swing on the right. She turns in 3 steps. Reflexes are trace in the lower extremities and 1+ in the upper extremities. Sensory exam is intact to light touch.   Assessment and Plan:   In summary, DEJHA KING is a very pleasant 81 y.o.-year old female with an underlying medical history of TIA, hypertension, hyperlipidemia, and overweight state, history of complex sleep apnea and PLMD, with a remote history of restless leg syndrome, who presents for followup consultation of her Parkinson's disease. She has a 5+ year history of right-sided predominant Parkinson's disease,  likely tremor predominant. She has been on low-dose levodopa and has been able to manage for several years quite well. Lately, she has had more depression and anxiety and reports more stress. She has had some changes to her mood medication.  I had a long chat with the patient and Tiffany Ramirez about Her symptoms, my findings and the diagnosis of parkinsonism/Parksinson's disease, its prognosis and treatment options. We talked about medical treatments and non-pharmacological approaches. We talked about maintaining  a healthy lifestyle in general. I encouraged the patient to eat healthy, exercise daily and keep well hydrated, to keep a scheduled bedtime and wake time routine, to not skip any meals and eat healthy snacks in between meals and to have protein with every meal. In particular, I stressed the importance of regular exercise, within of course the patient's own mobility limitations.   As far as further diagnostic testing is concerned, I suggested: no change.  As far as medications are concerned, I recommended the following at this time: Stop Wellbutrin and start Effexor long-acting, 37.5 mg once daily as prescribed by primary care physician. I would Ramirez for her to discontinue Flexeril at night. I would Ramirez for her to try melatonin at night for sleep. She has a long-standing history of insomnia. Sinemet continues at 25-100 milligrams strength one pill 3 times a day. She can take it at the current dose times which is 7 AM, 11:45 AM and 5 PM. In addition, I would Ramirez to introduce Sinemet CR 50-200 milligrams strength one pill at bedtime. I asked the patient to start walking regularly and drink more water. I would Ramirez to see her back in 3 months, sooner if needed. I answered all their questions today and the patient and Tiffany Ramirez were in agreement with the above outlined plan. I encouraged them to call or email through [place] with any interim questions, concerns, problems or updates or refill requests.  I spent 30  minutes in total face-to-face time with the patient, more than 50% of which was spent in counseling and coordination of care, reviewing test results, reviewing medication and discussing or reviewing the diagnosis of PD, its prognosis and treatment options.

## 2014-11-30 ENCOUNTER — Telehealth: Payer: Self-pay | Admitting: Neurology

## 2014-11-30 NOTE — Telephone Encounter (Signed)
I spoke to daughter and gave her the advice below. She voiced understanding.  Patient reports (over a week) that her R side has not been swinging as much when she walks or performs a task. She feels that it is more stiff. They are asking if this can be due to being under more stress with the move, medication related, or something else? Takes Sinemet IR at 7 am, 11:30am, and 5pm, then XR at night.

## 2014-11-30 NOTE — Telephone Encounter (Signed)
Sometimes PD symptoms get worse transiently because of some other stressors. I would like for her to monitor her symptoms more. Again, the recent stress of the move and the mood disorder and not doing well with the Effexor XR could have exacerbated her symptoms. I would not suggest that we change anything with her Sinemet at this time.

## 2014-11-30 NOTE — Telephone Encounter (Signed)
I spoke to daughter [name]). They are in [place] currently, states that the Effexor is not working well. According to [name], patient appears to be in a panic and having huge mood swings. Patient has stopped the Wellbutrin but if you recall she was only on that for a few days. Daughter spoke to pharmacist and discovered that patient was originally on Paxil and doing well on it. But for some reason had stopped taking it and that is when problems (reported at last visit) had started.  Also patient states that her L side is not moving as well, less swing during walking.  Daughter would like to know what you advise? Also asks if you would consider a short term, low dose xanax to help get patient out of her current panic state.

## 2014-11-30 NOTE — Telephone Encounter (Signed)
Patient's daughter is calling in regard to Rx Effexor 37.5 and would like to speak with you about another plan of action. Please call.

## 2014-11-30 NOTE — Telephone Encounter (Signed)
Best to take her to a PCP locally or urgent care. Hard to make suggestions at this time. They told me that Paxil caused an increase in tremors and that's why it was stopped. She could try to get a prescription for Paxil from the primary care physician to bridge her. Nevertheless, any new antidepressant can take weeks to start working as I explained to him during the recent visit. I do not suggest Xanax as it can be very sedating and impair balance and can be addicting.

## 2014-12-01 NOTE — Telephone Encounter (Signed)
Left message with information and recommendations below.

## 2014-12-31 ENCOUNTER — Other Ambulatory Visit: Payer: Self-pay

## 2014-12-31 DIAGNOSIS — K5909 Other constipation: Secondary | ICD-10-CM

## 2014-12-31 DIAGNOSIS — G20 Parkinson's disease: Secondary | ICD-10-CM

## 2014-12-31 DIAGNOSIS — F418 Other specified anxiety disorders: Secondary | ICD-10-CM

## 2014-12-31 MED ORDER — CARBIDOPA-LEVODOPA ER 50-200 MG PO TBCR: 1.0000 | EXTENDED_RELEASE_TABLET | Freq: Every day | ORAL | Status: DC

## 2015-02-22 ENCOUNTER — Ambulatory Visit (INDEPENDENT_AMBULATORY_CARE_PROVIDER_SITE_OTHER): Payer: Medicare Other | Admitting: Neurology

## 2015-02-22 ENCOUNTER — Encounter: Payer: Self-pay | Admitting: Neurology

## 2015-02-22 VITALS — BP 128/80 | HR 60 | Resp 16 | Ht 67.0 in | Wt 172.0 lb

## 2015-02-22 DIAGNOSIS — G20 Parkinson's disease: Secondary | ICD-10-CM

## 2015-02-22 DIAGNOSIS — F418 Other specified anxiety disorders: Secondary | ICD-10-CM | POA: Diagnosis not present

## 2015-02-22 DIAGNOSIS — K5900 Constipation, unspecified: Secondary | ICD-10-CM

## 2015-02-22 DIAGNOSIS — K5909 Other constipation: Secondary | ICD-10-CM

## 2015-02-22 MED ORDER — CARBIDOPA-LEVODOPA 25-100 MG PO TABS: 1.0000 | ORAL_TABLET | Freq: Four times a day (QID) | ORAL | Status: DC

## 2015-02-22 MED ORDER — CARBIDOPA-LEVODOPA ER 50-200 MG PO TBCR: 1.0000 | EXTENDED_RELEASE_TABLET | Freq: Every day | ORAL | Status: DC

## 2015-02-22 NOTE — Progress Notes (Signed)
Subjective:    Patient ID: Tiffany Ramirez is a 82 y.o. female.  HPI     Interim history:   Ms. Sitton is a very pleasant 82 year old right-handed woman with an underlying medical history of TIA, hypertension, hyperlipidemia, and overweight state, history of complex sleep apnea and PLMD, with a remote history of restless leg syndrome, who presents for followup consultation of her Parkinson's disease. She's accompanied by her husband and oldest daughter, Tiffany Ramirez, today. I first met her on 11/18/2014 and she previously followed with Dr. Jim Like and prior to that with Dr. Morene Antu. At the time of her visit in October 2016 she reported doing fairly well. Her daughter reported more depression and anxiety issues as well as more stress. Tiffany Ramirez was taking her up [place] to stay with her for the next several weeks. She was no longer on Paxil as she had side effects on it. She was recently started on Wellbutrin but she was in the process of switching to Effexor XR. She had no tremors on Paxil. Mood wise, she felt Paxil was helpful however. She reported Parkinson symptoms for about 5+ years prior. She had noted an intermittent right hand tremor in the beginning which gradually progressed. She started having issues with constipation for which she takes MiraLAX as needed. She was not exercising regularly and not always drinking enough water. I suggested she try melatonin for sleep for insomnia. I asked her to continue with Sinemet 25-100 milligrams strength one pill 3 times a day and add Sinemet CR 50-200 milligrams 1 pill at bedtime. In the interim, her daughter called on 11/30/2014 with problems with regards to her Effexor XR. She felt that she was doing worse on it. They requested an alternative prescription but [place] at the [place].  Today, 02/22/2015: She reports doing better overall. She spent about 5 weeks with her daughter in [place] and this did her well. She came back before  Thanksgiving. She feels that her Sinemet does not last as long and takes it currently at 7 AM, 11:30 AM, and 5 PM. She takes the CR at bedtime which is 11 PM and she also takes melatonin 6 mg at the time. She has been on Effexor XR 37.5 mg once daily in the morning. She did have an episode of double vision in November while she was staying with her daughter and saw an ophthalmologist and then a neuro-ophthalmologist as I understand. She had an MRI brain with and without contrast on 12/07/2014 and I reviewed the report: Impression: Findings consistent with moderate chronic small vessel ischemic disease. Moderate atrophy along the vertex with prominent CSF signal along the medial right and left frontal lobes. No acute infarct, hemorrhage or enhancing mass. She had blood work on 02/03/2015 which I reviewed: Hemoglobin A1c was 5.1, CBC with differential unremarkable, lipid profile showed total cholesterol of 171, triglycerides 203, LDL 82, HDL 48.5. Urinalysis was negative. TSH normal at 3.2 vitamin B12 elevated at greater than 1500.   Previously:   11/18/2014: This is her first visit with me and she previously followed with Dr. Jim Like and prior to that with Dr. Morene Antu. She was seen by Dr. Janann Colonel on 08/26/2012, at which time she was doing well. She was taking Sinemet 3 times a day, at 6:30 AM, 11:30 AM and 5:30 PM. She had right-sided more than left-sided symptoms, tremor predominant. She had intermittent possible dyskinesias of her left foot. She had some constipation.   She was  seen recently by [name], nurse practitioner on 09/21/2014, at which time she was kept on her medication regimen.  Dr. Morene Antu (02/12/12): He felt she was doing very well. She was to resume an exercise program and did not have any medication changes at the time. Her falls assessment tool score at the time was 8. She has an underlying medical history of stroke, hypertension, hyperlipidemia. She is currently on Flexeril  as needed, Inderal LA 80 mg daily, amlodipine-benazepril, slow FE, calcium vitamin D, baby aspirin, Protonix, Plavix, simvastatin, Sinemet 25/100 mg strength one tablet 3 times a day.  I reviewed Dr. Tressia Danas prior notes and the patient's records and below is a summary of that review:  82 year old right-handed woman who started noticing a hand tremor some 6 years ago and was first evaluated by Dr. love on 02/01/2009. This tremor started in her right thumb. Once this test is tremor. She has been on Inderal for 5 years in my sleep for 2 years she denies memory loss, hallucinations, delusions, or depression. She has sleep study due to sleepiness reported. This was done in October 2013 and showed mild. A clip movements of sleep and mild sleep apnea. She was unable to tolerate CPAP. In January her MMSE was 29, clock drawing was 4, animal fluency was 20.   Her Past Medical History Is Significant For: Past Medical History  Diagnosis Date  . Insomnia with sleep apnea, unspecified   . Sleep disorder   . Other and unspecified hyperlipidemia   . Unspecified essential hypertension   . Borderline diabetic     08-2014  . Depression   . Anxiety   . Parkinson's disease [facility])     Her Past Surgical History Is Significant For: Past Surgical History  Procedure Laterality Date  . Cataract extraction Bilateral     Her Family History Is Significant For: Family History  Problem Relation Age of Onset  . Heart failure Mother   . Heart attack Father   . Brain cancer Sister   . Psychiatric Illness Sister   . Stroke Sister   . Diabetes Brother     Her Social History Is Significant For: Social History   Social History  . Marital Status: Married    Spouse Name: Tiffany Ramirez  . Number of Children: 2  . Years of Education: College   Occupational History  . Retired     Materials engineer   Social History Main Topics  . Smoking status: Never Smoker   . Smokeless tobacco: Never Used  . Alcohol Use: No  . Drug Use: No   . Sexual Activity: Not Asked   Other Topics Concern  . None   Social History Narrative   Patient lives at home with her husband Tiffany Ramirez. Patient is a homemaker.   Right handed.   One cup of coffee daily.    Her Allergies Are:  Allergies  Allergen Reactions  . Phenobarbital Other (See Comments)    Overall restlessness  :   Her Current Medications Are:  Outpatient Encounter Prescriptions as of 02/22/2015  Medication Sig  . CALCIUM ASCORBATE PO Take 600 mg by mouth daily.   . carbidopa-levodopa (SINEMET CR) 50-200 MG tablet Take 1 tablet by mouth at bedtime.  . carbidopa-levodopa (SINEMET IR) 25-100 MG tablet Take 1 tablet by mouth 3 (three) times daily.  . clopidogrel (PLAVIX) 75 MG tablet Take 75 mg by mouth daily.  . Ferrous Sulfate (SLOW FE PO) Take 1 tablet by mouth.  . hydrochlorothiazide (HYDRODIURIL)  12.5 MG tablet Take 25 mg by mouth daily.  . Melatonin 3 MG TABS Take by mouth.  . pantoprazole (PROTONIX) 20 MG tablet Take 40 mg by mouth daily.   . propranolol (INDERAL) 80 MG tablet Take 80 mg by mouth daily.   Marland Kitchen SIMVASTATIN PO Take 20 mg by mouth daily.   Marland Kitchen venlafaxine (EFFEXOR) 37.5 MG tablet Take 37.5 mg by mouth 2 (two) times daily.  . [DISCONTINUED] aspirin 81 MG tablet Take 81 mg by mouth daily.  . [DISCONTINUED] cyclobenzaprine (FLEXERIL) 10 MG tablet Take 1 tablet (10 mg total) by mouth at bedtime as needed for muscle spasms.  . [DISCONTINUED] Red Yeast Rice Extract (RED YEAST RICE PO) Take 1 capsule by mouth daily.   No facility-administered encounter medications on file as of 02/22/2015.  :  Review of Systems:  Out of a complete 14 point review of systems, all are reviewed and negative with the exception of these symptoms as listed below:   Review of Systems  Neurological:       Patient c/o double vision. Patient was seen at neurophthalmologist. MRI done.  Left side headaches sometimes waking her up in the night.  Rigditity in R arm.     Objective:   Neurologic Exam  Physical Exam Physical Examination:   Filed Vitals:   02/22/15 1100  BP: 128/80  Pulse: 60  Resp: 16    General Examination: The patient is a very pleasant 82 y.o. female in no acute distress. She is very well dressed. She is in good spirits today.   HEENT: Normocephalic, atraumatic, pupils are equal, round and reactive to light and accommodation. Funduscopic exam is normal with sharp disc margins noted. She is status post bilateral cataract repairs. Extraocular tracking shows mild to moderate saccadic breakdown. She has no nystagmus. She has a mild decrease in eye blink rate and mild facial masking. Hearing is intact. Speech is mildly hypophonic. She has an intermittent lower lip and jaw tremor, unchanged. Neck is mildly rigid. Oropharynx exam shows mild to moderate mouth dryness. Tongue protrudes centrally and palate elevates symmetrically.   Chest: is clear to auscultation without wheezing, rhonchi or crackles noted.  Heart: sounds are regular and normal without murmurs, rubs or gallops noted.   Abdomen: is soft, non-tender and non-distended with normal bowel sounds appreciated on auscultation.  Extremities: There is trace pitting edema in the distal lower extremities bilaterally. Pedal pulses are intact.   Skin: is warm and dry with no trophic changes noted. Age-related changes are noted on the skin.   Musculoskeletal: exam reveals no obvious joint deformities, tenderness, joint swelling or erythema.  Neurologically:  Mental status: The patient is awake and alert, paying good  attention. She is able to provide the history. Her daughter provides some details.  The patient's memory, attention, language and knowledge seemed well preserved.  She may have mild bradyphrenia.   Cranial nerves are as described above under HEENT exam. In addition, shoulder shrug is normal with slightly unequal shoulder height noted, right higher than left.   Motor exam: Normal bulk,  and strength for age is noted. There are no dyskinesias noted. She has an intermittent mild to at times moderate resting tremor in the right upper extremity. She has no overt tremor otherwise. Fine motor skills are moderately impaired with finger taps, hand movements and rapid alternating patting in the right upper extremity and mild to moderately impaired in the left upper extremity. Foot agility and foot taps are moderately impaired on  the right and mildly so on the left. She stands up with mild difficulty but does not have to push herself up. Her posture is mildly stooped. She has a slight tilt to the right. She walks with decreased stride length and decreased pace and decreased arm swing on the left and near absent arm swing on the right. She turns in 3 steps. Reflexes are trace in the lower extremities and 1+ in the upper extremities. Sensory exam is intact to light touch. Tandem walk is not possible.   Assessment and Plan:   In summary, TOMICA ARSENEAULT is a very pleasant 82 year old female with an underlying medical history of TIA, hypertension, hyperlipidemia, and overweight state, history of complex sleep apnea and PLMD, with a remote history of restless leg syndrome, who presents for followup consultation of her Parkinson's disease. She has a 5+ year history of right-sided predominant Parkinson's disease.  she was on lower dose levodopa for quite some time and managed well for several years. She had an interim increase in her stress level, anxiety and depression, all of which have improved, particularly after she spent some time with her daughter up [place] in [place]. She is stable on Effexor long-acting 37.5 mg daily at this time. I suggested she continue with this. She has been on propranolol long-acting, 80 mg once daily for years. I suggested we continue with this for now. If she should have more lightheadedness, lethargy, drop in blood pressure, we may have to scale back on this. She sees her  primary care physician on a regular basis. She had an interim MRI brain with and without contrast we talked about the test results, most of these changes are age-appropriate and not concerning. She has been on carbidopa-levodopa 25-100 milligrams strength one pill 3 times a day, she feels that it does not last for as long. I suggested we increase it by 1 pill and that she take it on a 3 hourly basis, namely at 7 AM, 10 AM, 1 PM and 5 PM and that she take the CR more closer to 9 PM along with her melatonin.  I asked the patient to walk regularly and drink more water. I would like to see her back in 4 months, sooner if needed. I answered all their questions today and the patient and her family were in agreement with the above outlined plan. I encouraged them to call or email through [facility] with any interim questions, concerns, problems or updates or refill requests.  I spent 25 minutes in total face-to-face time with the patient, more than 50% of which was spent in counseling and coordination of care, reviewing test results, reviewing medication and discussing or reviewing the diagnosis of PD, its prognosis and treatment options.

## 2015-02-22 NOTE — Patient Instructions (Addendum)
I think your Parkinson's disease has remained fairly stable, which is reassuring. Nevertheless, as you know, this disease does progress with time. It can affect your balance, your memory, your mood, your bowel and bladder function, your posture, balance and walking and your activities of daily living. However, there are good supportive treatments and symptomatic treatments available, so most patients have a change to a good quality life and life expectancy is not typically altered. Overall you are doing fairly well but I do want to suggest a few things today:  Remember to drink plenty of fluid at least 6 glasses (8 oz each), eat healthy meals and do not skip any meals. Try to eat protein with a every meal and eat a healthy snack such as fruit or nuts in between meals. Try to keep a regular sleep-wake schedule and try to exercise daily, particularly in the form of walking, 20-30 minutes a day, if you can.   Taking your medication on schedule is key.   Try to stay active physically and mentally. Engage in social activities in your community and with your family and try to keep up with current events by reading the newspaper or watching the news. Try to do word puzzles and you may like to do puzzles and brain games on the computer such as on [email or web address]/.   As far as your medications are concerned, I would like to suggest that you take your current medication with the following additional changes: take Sinemet 25/100 mg: 1 pill 4 times a day, at 7 AM, 10 AM, 1 PM, and 5 PM, and the CR at 9 PM and melatonin at that time as well.     I would like to see you back in 4 months, sooner if we need to. Please call us with any interim questions, concerns, problems, updates or refill requests.  Our phone number is [phone]. We also have an after hours call service for urgent matters and there is a physician on-call for urgent questions, that cannot wait till the next work day. For any emergencies you know to call  911 or go to the nearest emergency room.   You can email me through my chart and also leave a phone message for Beverlee Nims, my nurse.

## 2015-05-04 ENCOUNTER — Telehealth: Payer: Self-pay | Admitting: Neurology

## 2015-05-04 NOTE — Telephone Encounter (Signed)
I called, left a message, I will call back later. 

## 2015-05-04 NOTE — Telephone Encounter (Signed)
I called, talked with the daughter. The patient has had 2 issues, she is taking the medication 5 times daily, there is difficulty keeping up with the dosing. We could try going to the 50/200 mg tablet 3 times daily. The patient is also having some difficulty ruminating about things that happened in the past, she is angry about issues. She cannot stop thinking about these things. The patient is on Effexor, the dose could be increased. The daughter will talk to the patient, and she wishes to switch over to the Sinemet CR 3 times daily, we will initiate this.

## 2015-05-04 NOTE — Telephone Encounter (Signed)
Daughter Manuela Neptune [phone], called to advise of "drastic changes in patient and patient isn't bouncing back", states medication was increased carbidopa-levodopa (SINEMET CR) 50-200 MG tablet, carbidopa-levodopa (SINEMET IR) 25-100 MG tablet, patient has been "taking it differently (on tongue and dissolved), very angry, bringing up past in very angry way. Situation is very bad".

## 2015-05-04 NOTE — Telephone Encounter (Signed)
Dr Jannifer Franklin- please advise  LVM returning call from Manuela Neptune, daughter. Gave [facility] phone number.

## 2015-07-20 ENCOUNTER — Encounter: Payer: Self-pay | Admitting: Neurology

## 2015-07-20 ENCOUNTER — Ambulatory Visit (INDEPENDENT_AMBULATORY_CARE_PROVIDER_SITE_OTHER): Payer: Medicare Other | Admitting: Neurology

## 2015-07-20 VITALS — BP 128/76 | HR 70 | Resp 16 | Ht 67.0 in | Wt 183.0 lb

## 2015-07-20 DIAGNOSIS — G20 Parkinson's disease: Secondary | ICD-10-CM | POA: Diagnosis not present

## 2015-07-20 DIAGNOSIS — F418 Other specified anxiety disorders: Secondary | ICD-10-CM | POA: Diagnosis not present

## 2015-07-20 DIAGNOSIS — F329 Major depressive disorder, single episode, unspecified: Secondary | ICD-10-CM

## 2015-07-20 DIAGNOSIS — F32A Depression, unspecified: Secondary | ICD-10-CM

## 2015-07-20 DIAGNOSIS — R600 Localized edema: Secondary | ICD-10-CM | POA: Diagnosis not present

## 2015-07-20 DIAGNOSIS — K5909 Other constipation: Secondary | ICD-10-CM

## 2015-07-20 DIAGNOSIS — K5900 Constipation, unspecified: Secondary | ICD-10-CM | POA: Diagnosis not present

## 2015-07-20 MED ORDER — CARBIDOPA-LEVODOPA 25-100 MG PO TABS: 1.0000 | ORAL_TABLET | Freq: Every day | ORAL | Status: DC

## 2015-07-20 NOTE — Progress Notes (Signed)
Subjective:    Patient ID: Tiffany Ramirez is a 82 y.o. female.  HPI     Interim history:   Tiffany Ramirez is a very pleasant 82 year old right-handed woman with an underlying medical history of TIA, hypertension, hyperlipidemia, and overweight state, history of complex sleep apnea and PLMD (with a remote history of RLS), who presents for followup consultation of her Parkinson's disease. She's accompanied by her husband and oldest daughter, Tiffany Ramirez, today. I last saw her on 02/22/2015, at which time she reported doing better overall. She had spent some time with her daughter up in [place] and this went well. She came back around Thanksgiving 2016. She felt that the Sinemet was not lasting as long and was taking it at 7 AM, 11:30 AM, and 5 PM. She was taking the CR at bedtime, around 11 PM. She was also taking melatonin 6 mg at bedtime. She had been on Effexor XR 37.5 mg once daily in the morning. She did have an episode of double vision in November while she was staying with her daughter and saw an ophthalmologist and then a neuro-ophthalmologist as I understand. She had an MRI brain with and without contrast on 12/07/2014: Findings consistent with moderate chronic small vessel ischemic disease. Moderate atrophy along the vertex with prominent CSF signal along the medial right and left frontal lobes. No acute infarct, hemorrhage or enhancing mass. She had blood work on 02/03/2015: Hemoglobin A1c was 5.1, CBC with differential unremarkable, lipid profile showed total cholesterol of 171, triglycerides 203, LDL 82, HDL 48.5. Urinalysis was negative. TSH normal at 3.2 vitamin B12 elevated at greater than 1500.   I suggested we change her medication regimen slightly with Sinemet 1 pill 4 times a day at 7 AM, 10 AM, 1 PM and 5 PM, the CR at 9 PM as well as melatonin at 9 PM. Her daughter called in the interim in April 2017 reporting additional problems with mood, including rumination of thoughts and  frustration. She spoke with Dr. Jannifer Franklin at the time, who suggested that her Sinemet could be changed to Sinemet CR and the Effexor could be increased.  Today, 07/20/2015: She reports taking the C/L about 3 hours apart, about 7, 10, 1 PM,  and 4 PM. She feels she is doing quite well. Daughter endorses that she is doing better than a couple months ago. Mood is stable. She is taking low-dose Effexor XR 37.5 mg daily, she does become tearful at times. She feels that things from the past catch up and she can't get rid of some of the sadness. On the positive side, they had a wedding in the family recently which was very nice. Most of the family got together for that occasion. She did not have any recent falls. She takes her CR at night around 9 PM, no longer on melatonin as she feels that she is sleeping better.  Previously  I first met her on 11/18/2014 and she previously followed with Dr. Jim Like and prior to that with Dr. Morene Antu. At the time of her visit in October 2016 she reported doing fairly well. Her daughter reported more depression and anxiety issues as well as more stress. Tiffany Ramirez was taking her up [place] to stay with her for the next several weeks. She was no longer on Paxil as she had side effects on it. She was recently started on Wellbutrin but she was in the process of switching to Effexor XR. She had no tremors on Paxil. Mood wise,  she felt Paxil was helpful however. She reported Parkinson symptoms for about 5+ years prior. She had noted an intermittent right hand tremor in the beginning which gradually progressed. She started having issues with constipation for which she takes MiraLAX as needed. She was not exercising regularly and not always drinking enough water. I suggested she try melatonin for sleep for insomnia. I asked her to continue with Sinemet 25-100 milligrams strength one pill 3 times a day and add Sinemet CR 50-200 milligrams 1 pill at bedtime. In the interim, her daughter  called on 11/30/2014 with problems with regards to her Effexor XR. She felt that she was doing worse on it. They requested an alternative prescription but [place] was [place] at the time.  11/18/2014: This is her first visit with me and she previously followed with Dr. Jim Like and prior to that with Dr. Morene Antu. She was seen by Dr. Janann Colonel on 08/26/2012, at which time she was doing well. She was taking Sinemet 3 times a day, at 6:30 AM, 11:30 AM and 5:30 PM. She had right-sided more than left-sided symptoms, tremor predominant. She had intermittent possible dyskinesias of her left foot. She had some constipation.   She was seen recently by [name], nurse practitioner on 09/21/2014, at which time she was kept on her medication regimen.  Dr. Morene Antu (02/12/12): He felt she was doing very well. She was to resume an exercise program and did not have any medication changes at the time. Her falls assessment tool score at the time was 8. She has an underlying medical history of stroke, hypertension, hyperlipidemia. She is currently on Flexeril as needed, Inderal LA 80 mg daily, amlodipine-benazepril, slow FE, calcium vitamin D, baby aspirin, Protonix, Plavix, simvastatin, Sinemet 25/100 mg strength one tablet 3 times a day.  I reviewed Dr. Tressia Danas prior notes and the patient's records and below is a summary of that review:  82 year old right-handed woman who started noticing a hand tremor some 6 years ago and was first evaluated by Dr. love on 02/01/2009. This tremor started in her right thumb. Once this test is tremor. She has been on Inderal for 5 years in my sleep for 2 years she denies memory loss, hallucinations, delusions, or depression. She has sleep study due to sleepiness reported. This was done in October 2013 and showed mild. A clip movements of sleep and mild sleep apnea. She was unable to tolerate CPAP. In January her MMSE was 29, clock drawing was 4, animal fluency was 20.  Her Past  Medical History Is Significant For: Past Medical History  Diagnosis Date  . Insomnia with sleep apnea, unspecified   . Sleep disorder   . Other and unspecified hyperlipidemia   . Unspecified essential hypertension   . Borderline diabetic     08-2014  . Depression   . Anxiety   . Parkinson's disease [facility])     Her Past Surgical History Is Significant For: Past Surgical History  Procedure Laterality Date  . Cataract extraction Bilateral     Her Family History Is Significant For: Family History  Problem Relation Age of Onset  . Heart failure Mother   . Heart attack Father   . Brain cancer Sister   . Psychiatric Illness Sister   . Stroke Sister   . Diabetes Brother     Her Social History Is Significant For: Social History   Social History  . Marital Status: Married    Spouse Name: Tiffany Ramirez  . Number  of Children: 2  . Years of Education: College   Occupational History  . Retired     Materials engineer   Social History Main Topics  . Smoking status: Never Smoker   . Smokeless tobacco: Never Used  . Alcohol Use: No  . Drug Use: No  . Sexual Activity: Not Asked   Other Topics Concern  . None   Social History Narrative   Patient lives at home with her husband Tiffany Ramirez. Patient is a homemaker.   Right handed.   One cup of coffee daily.    Her Allergies Are:  Allergies  Allergen Reactions  . Phenobarbital Other (See Comments)    Overall restlessness  :   Her Current Medications Are:  Outpatient Encounter Prescriptions as of 07/20/2015  Medication Sig  . CALCIUM ASCORBATE PO Take 600 mg by mouth daily.   . carbidopa-levodopa (SINEMET CR) 50-200 MG tablet Take 1 tablet by mouth at bedtime.  . carbidopa-levodopa (SINEMET IR) 25-100 MG tablet Take 1 tablet by mouth 4 (four) times daily.  . clopidogrel (PLAVIX) 75 MG tablet Take 75 mg by mouth daily.  . Cyanocobalamin (B-12) 2500 MCG TABS Take by mouth.  . Ferrous Sulfate (SLOW FE PO) Take 1 tablet by mouth.  .  hydrochlorothiazide (HYDRODIURIL) 12.5 MG tablet Take 25 mg by mouth daily.  . Melatonin 3 MG TABS Take by mouth.  . pantoprazole (PROTONIX) 20 MG tablet Take 40 mg by mouth daily.   . propranolol (INDERAL) 80 MG tablet Take 80 mg by mouth daily.   Marland Kitchen SIMVASTATIN PO Take 20 mg by mouth daily.   Marland Kitchen venlafaxine (EFFEXOR) 37.5 MG tablet Take 37.5 mg by mouth 2 (two) times daily.   No facility-administered encounter medications on file as of 07/20/2015.  :  Review of Systems:  Out of a complete 14 point review of systems, all are reviewed and negative with the exception of these symptoms as listed below:   Review of Systems  Neurological:       No new concerns per patient.  She called a couple of weeks ago and spoke with on-call physician. Her Sinemet IR was increased to 4 tabs a day.     Objective:  Neurologic Exam  Physical Exam Physical Examination:   Filed Vitals:   07/20/15 1206  BP: 128/76  Pulse: 70  Resp: 16    General Examination: The patient is a very pleasant 82 y.o. female in no acute distress. She is very well dressed. She is in good spirits today. Mildly tearful once.   HEENT: Normocephalic, atraumatic, pupils are equal, round and reactive to light and accommodation. Funduscopic exam is normal with sharp disc margins noted. She is status post bilateral cataract repairs. Extraocular tracking shows mild to moderate saccadic breakdown. She has no nystagmus. She has a mild decrease in eye blink rate and mild facial masking. Hearing is intact. Speech is mildly hypophonic. She has an intermittent lower lip and jaw tremor, unchanged. Neck is mildly rigid. Oropharynx exam shows mild to moderate mouth dryness. Tongue protrudes centrally and palate elevates symmetrically.   Chest: is clear to auscultation without wheezing, rhonchi or crackles noted.  Heart: sounds are regular and normal without murmurs, rubs or gallops noted.   Abdomen: is soft, non-tender and non-distended with  normal bowel sounds appreciated on auscultation.  Extremities: There is 1+ pitting edema in the distal lower extremities bilaterally. Pedal pulses are intact.   Skin: is warm and dry with no trophic changes noted, Except  for chronic stasis type changes and mild discoloration of the distal lower extremities and feet. Age-related changes are noted on the skin.   Musculoskeletal: exam reveals no obvious joint deformities, tenderness, joint swelling or erythema.  Neurologically:  Mental status: The patient is awake and alert, paying good  attention. She is able to provide the history. Her daughter provides some details.  The patient's memory, attention, language and knowledge seemed well preserved.  She may have mild bradyphrenia.   Cranial nerves are as described above under HEENT exam. In addition, shoulder shrug is normal with slightly unequal shoulder height noted, right higher than left.   Motor exam: Normal bulk, and strength for age is noted. There are no dyskinesias noted. She has an intermittent mild to at times moderate resting tremor in the right upper extremity. She has no overt tremor otherwise. Fine motor skills are moderately impaired with finger taps, hand movements and rapid alternating patting in the right upper extremity and mild to moderately impaired in the left upper extremity. Foot agility and foot taps are moderately impaired on the right and mildly so on the left. She stands up with mild difficulty but does not have to push herself up. Her posture is mildly stooped. She has a slight tilt to the right. She walks with decreased stride length and mildly decreased pace and decreased arm swing on the left and near absent arm swing on the right. She turns in 3 steps. Reflexes are trace in the lower extremities and 1+ in the upper extremities. Sensory exam is intact to light touch. Tandem walk is not possible for her.   Assessment and Plan:   In summary, CECYLIA BRAZILL is a very  pleasant 82 year old female with an underlying medical history of TIA, hypertension, hyperlipidemia, and overweight state, history of complex sleep apnea and PLMD, with a remote history of restless leg syndrome, who presents for followup consultation of her Parkinson's disease. She has a 5 to 6 year history of right-sided predominant Parkinson's disease.  she was on lower dose levodopa for quite some time and managed well for several years. She had an interim increase in her stress level, anxiety and depression, all of which have improved to some degree, particularly after she spent some time with her daughter up [place] in [place]. She is on low-dose Effexor long-acting 37.5 mg daily at this time. I suggested she continue with this. In addition, I think she would benefit from consultation with a geriatric psychiatrist and perhaps even counseling. She is agreeable to this. I made a referral to Dr. Casimiro Needle. She has been on propranolol long-acting, 80 mg once daily for years. I also asked her to talk to her primary care physician about potentially seeing a vascular/pain specialist because of lower extremity edema and chronic appearing discoloration noted in her feet and distal lower extremities. Edema seems a little bit worse today than before. Today, I suggested she add one more dose of levodopa at 7 PM. To that end, she will take 1 pill 5 times a day namely at 7 AM, 10 AM, 1 PM, 4 PM and 7 PM. She will take the CR around 9 PM.  I asked the patient to walk regularly and drink more water. I would like to see her back in 4 months, sooner if needed. I answered all their questions today and the patient and her family were in agreement with the above outlined plan. I encouraged them to call or email through [facility] with any  interim questions, concerns, problems or updates or refill requests.  I spent 25 minutes in total face-to-face time with the patient, more than 50% of which was spent in counseling and coordination  of care, reviewing test results, reviewing medication and discussing or reviewing the diagnosis of PD, its prognosis and treatment options.

## 2015-07-20 NOTE — Patient Instructions (Addendum)
Let's increase your sinemet to 1 pill 5 times a day: at 7 AM, 10 AM, 1 PM, 4 PM and 7 PM.  Please continue with the Effexor XR at 37.5 mg daily. As discussed, I will make a referral to geriatric psychiatry, Dr. Casimiro Needle.   Talk to Dr. Sabra Heck about seeing a vascular/vein specialist.

## 2015-08-03 ENCOUNTER — Telehealth: Payer: Self-pay | Admitting: Neurology

## 2015-08-03 DIAGNOSIS — G20A1 Parkinson's disease without dyskinesia, without mention of fluctuations: Secondary | ICD-10-CM

## 2015-08-03 DIAGNOSIS — G20 Parkinson's disease: Secondary | ICD-10-CM

## 2015-08-03 NOTE — Telephone Encounter (Signed)
Pt called sts [facility] has changed manufactures for carbidopa-levodopa (SINEMET IR) 25-100 MG tablet and it is not as effective. She sts she would like to go back to original RX if that is possible. Please call

## 2015-08-04 MED ORDER — CARBIDOPA-LEVODOPA 25-100 MG PO TABS: 1.0000 | ORAL_TABLET | Freq: Every day | ORAL | Status: DC

## 2015-08-04 NOTE — Telephone Encounter (Signed)
I called patient back. Patient states that since change of her medication, she has had increased problems. She would like to go back to Sinemet name brand. Patient spoke with [facility], they told her that if we send in a new Rx stating *Must Have Name Brand*, she will get her medication from original manufacturer. How would you like to proceed?

## 2015-08-04 NOTE — Telephone Encounter (Signed)
Rx resubmitted for Sinemet for DAW.

## 2015-08-04 NOTE — Addendum Note (Signed)
Addended by: Star Age on: 08/04/2015 03:02 PM   Modules accepted: Orders

## 2015-08-09 NOTE — Telephone Encounter (Signed)
Received PA request from [facility]. Form has been filled out, waiting for signature.

## 2015-08-10 NOTE — Telephone Encounter (Signed)
Approval form received. Case# [id number]. Approved from 07/10/2015-08/08/2016

## 2015-08-27 ENCOUNTER — Telehealth: Payer: Self-pay | Admitting: Neurology

## 2015-08-27 NOTE — Telephone Encounter (Signed)
erroro

## 2015-08-27 NOTE — Telephone Encounter (Signed)
JoAnne our phone operator told me of the trouble pt's husband was having trying to reach Dr. Karen Chafe office to make appt. I called the office and asked to speak with the manager. After being transferred and speaking with a front office rep about the issue of getting pt in and the trouble the husband was having she transferred me to voice mail. I was told the office manager was [facility].   Can we refer pt to a different office?

## 2015-08-27 NOTE — Telephone Encounter (Signed)
Please resend psych referral to another practice.
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
## 2015-08-30 ENCOUNTER — Encounter (HOSPITAL_COMMUNITY): Payer: Self-pay

## 2015-09-01 NOTE — Telephone Encounter (Signed)
Called and spoke to husband he relayed to me that I could disregard call . Patient has apt. Next week . Thanks Hinton Dyer.

## 2015-09-08 ENCOUNTER — Telehealth: Payer: Self-pay | Admitting: Neurology

## 2015-09-08 DIAGNOSIS — G20 Parkinson's disease: Secondary | ICD-10-CM

## 2015-09-08 MED ORDER — CARBIDOPA-LEVODOPA 25-100 MG PO TABS: 1.0000 | ORAL_TABLET | Freq: Every day | ORAL | 3 refills | Status: DC

## 2015-09-08 NOTE — Telephone Encounter (Signed)
I spoke to patient. They needed refill of C/L 25-100 to be called into local pharmacy. Rx sent in

## 2015-09-08 NOTE — Telephone Encounter (Signed)
Daughter called to advise there is a problem with Rx, states generic was sent to pharmacy [facility] [place] [facility] [place] [facility] [address] and Brand name of this medication was requested, Brand works better, states they need this medication sent to pharmacy before they leave town tomorrow morning. Please call [phone].

## 2015-09-08 NOTE — Telephone Encounter (Signed)
referral on  pt Tiffany Ramirez 1928/06/24 referral on 07/22/2015 [facility] [phone]  They have be unable to reach Family to schedule they are closing her referral. Per [facility] has made ser val attempts. Thanks Hinton Dyer.

## 2015-09-08 NOTE — Telephone Encounter (Signed)
error 

## 2015-09-13 NOTE — Telephone Encounter (Signed)
I know they are currently out of town. [name], patient's daughter, called [place] the other day from number [phone]. I am not sure if their office has that updated information?

## 2015-09-13 NOTE — Telephone Encounter (Signed)
[facility] [phone]  Silvano Bilis and left on her  Voice mail and relayed if she had any question to call me back but [facility] had been trying to reach out to family to schedule. Thanks Hinton Dyer .

## 2015-09-21 ENCOUNTER — Ambulatory Visit: Payer: Medicare Other | Admitting: Nurse Practitioner

## 2015-10-04 ENCOUNTER — Telehealth: Payer: Self-pay

## 2015-10-04 NOTE — Telephone Encounter (Signed)
I received a letter from [employer] stating that I request for reimbursement for Sinemet 25-100 has been approved for 09/08/2015-09/11/2015.

## 2015-11-12 ENCOUNTER — Other Ambulatory Visit: Payer: Self-pay | Admitting: Nurse Practitioner

## 2015-11-16 ENCOUNTER — Ambulatory Visit (INDEPENDENT_AMBULATORY_CARE_PROVIDER_SITE_OTHER): Payer: Medicare Other | Admitting: Neurology

## 2015-11-16 ENCOUNTER — Encounter: Payer: Self-pay | Admitting: Neurology

## 2015-11-16 VITALS — BP 132/80 | HR 70 | Resp 16 | Ht 67.0 in | Wt 187.0 lb

## 2015-11-16 DIAGNOSIS — K5909 Other constipation: Secondary | ICD-10-CM

## 2015-11-16 DIAGNOSIS — G20 Parkinson's disease: Secondary | ICD-10-CM | POA: Diagnosis not present

## 2015-11-16 DIAGNOSIS — F418 Other specified anxiety disorders: Secondary | ICD-10-CM | POA: Diagnosis not present

## 2015-11-16 DIAGNOSIS — Z9181 History of falling: Secondary | ICD-10-CM

## 2015-11-16 DIAGNOSIS — G479 Sleep disorder, unspecified: Secondary | ICD-10-CM

## 2015-11-16 MED ORDER — CYCLOBENZAPRINE HCL 10 MG PO TABS: 10.0000 mg | ORAL_TABLET | Freq: Every evening | ORAL | 3 refills | Status: DC | PRN

## 2015-11-16 MED ORDER — CARBIDOPA-LEVODOPA ER 50-200 MG PO TBCR: 1.0000 | EXTENDED_RELEASE_TABLET | Freq: Every day | ORAL | 3 refills | Status: DC

## 2015-11-16 MED ORDER — CARBIDOPA-LEVODOPA 25-100 MG PO TABS: 1.0000 | ORAL_TABLET | Freq: Four times a day (QID) | ORAL | 3 refills | Status: DC

## 2015-11-16 NOTE — Progress Notes (Signed)
Subjective:    Patient ID: Tiffany Ramirez is a 82 y.o. female.  HPI     Interim history:  Tiffany Ramirez is a very pleasant 82 year old right-handed woman with an underlying medical history of TIA, hypertension, hyperlipidemia, and overweight state, history of complex sleep apnea and PLMD (with a remote history of RLS), who presents for followup consultation of her Parkinson's disease. She's accompanied by her husband today. I last saw her on 07/20/2015, at which time she was taking Sinemet about 3 hours apart around 7, 10, 1 PM and 4 PM. She was doing fairly well, mood was stable, she was 7 low-dose long-acting Effexor 37.5 mg daily. No recent falls reported. She was taking her Sinemet CR at 9 PM and no longer any melatonin as she felt she was sleeping better. We mutually agreed to make a referral to geriatric psychiatry.   Today, 11/16/2015: She reports doing okay, taking C/L 4 times a day, at 7 AM, 11 AM, 3 PM, and 6 PM and the CR at night. In the interim, I referred her to another psychiatrist and I received a phone call from [facility] that they were trying to get in touch with them for scheduling. Patient reports, that she saw Dr. Casimiro Ramirez once and he recommended she FU with her counselor, sees her about 1/month. Unfortunately, she did have a fall a couple of months ago. She was walking with something in her hand and bumped into the side of the building, she twisted or injured her left ankle but had no sustained pain or injury, she also fell on the ground and may have hit the back of her head but had no loss of consciousness, no laceration, no headache, no mental status changes and feels fine at this time. She still drives without problems, usually just local roads and no highway driving at this time. Tries to walk about 20 minutes each day.  Previously  I saw her on 02/22/2015, at which time she reported doing better overall. She had spent some time with her daughter up in [place] and  this went well. She came back around Thanksgiving 2016. She felt that the Sinemet was not lasting as long and was taking it at 7 AM, 11:30 AM, and 5 PM. She was taking the CR at bedtime, around 11 PM. She was also taking melatonin 6 mg at bedtime. She had been on Effexor XR 37.5 mg once daily in the morning. She did have an episode of double vision in November while she was staying with her daughter and saw an ophthalmologist and then a neuro-ophthalmologist as I understand. She had an MRI brain with and without contrast on 12/07/2014: Findings consistent with moderate chronic small vessel ischemic disease. Moderate atrophy along the vertex with prominent CSF signal along the medial right and left frontal lobes. No acute infarct, hemorrhage or enhancing mass. She had blood work on 02/03/2015: Hemoglobin A1c was 5.1, CBC with differential unremarkable, lipid profile showed total cholesterol of 171, triglycerides 203, LDL 82, HDL 48.5. Urinalysis was negative. TSH normal at 3.2 vitamin B12 elevated at greater than 1500.    I suggested we change her medication regimen slightly with Sinemet 1 pill 4 times a day at 7 AM, 10 AM, 1 PM and 5 PM, the CR at 9 PM as well as melatonin at 9 PM. Her daughter called in the interim in April 2017 reporting additional problems with mood, including rumination of thoughts and frustration. She spoke with Dr. Jannifer Ramirez at  the time, who suggested that her Sinemet could be changed to Sinemet CR and the Effexor could be increased.   I first met her on 11/18/2014 and she previously followed with Dr. Jim Ramirez and prior to that with Dr. Morene Ramirez. At the time of her visit in October 2016 she reported doing fairly well. Her daughter reported more depression and anxiety issues as well as more stress. Tiffany Ramirez was taking her up [place] to stay with her for the next several weeks. She was no longer on Paxil as she had side effects on it. She was recently started on Wellbutrin but she was in the  process of switching to Effexor XR. She had no tremors on Paxil. Mood wise, she felt Paxil was helpful however. She reported Parkinson symptoms for about 5+ years prior. She had noted an intermittent right hand tremor in the beginning which gradually progressed. She started having issues with constipation for which she takes MiraLAX as needed. She was not exercising regularly and not always drinking enough water. I suggested she try melatonin for sleep for insomnia. I asked her to continue with Sinemet 25-100 milligrams strength one pill 3 times a day and add Sinemet CR 50-200 milligrams 1 pill at bedtime. In the interim, her daughter called on 11/30/2014 with problems with regards to her Effexor XR. She felt that she was doing worse on it. They requested an alternative prescription but she was [place] of [place] at the time.   11/18/2014: This is her first visit with me and she previously followed with Dr. Jim Ramirez and prior to that with Dr. Morene Ramirez. She was seen by Dr. Janann Ramirez on 08/26/2012, at which time she was doing well. She was taking Sinemet 3 times a day, at 6:30 AM, 11:30 AM and 5:30 PM. She had right-sided more than left-sided symptoms, tremor predominant. She had intermittent possible dyskinesias of her left foot. She had some constipation.   She was seen recently by Tiffany Ramirez, nurse practitioner on 09/21/2014, at which time she was kept on her medication regimen.   Dr. Morene Ramirez (02/12/12): He felt she was doing very well. She was to resume an exercise program and did not have any medication changes at the time. Her falls assessment tool score at the time was 8. She has an underlying medical history of stroke, hypertension, hyperlipidemia. She is currently on Flexeril as needed, Inderal LA 80 mg daily, amlodipine-benazepril, slow FE, calcium vitamin D, baby aspirin, Protonix, Plavix, simvastatin, Sinemet 25/100 mg strength one tablet 3 times a day.   I reviewed Tiffany Ramirez prior notes and  the patient's records and below is a summary of that review:   82 year old right-handed woman who started noticing a hand tremor some 6 years ago and was first evaluated by Dr. love on 02/01/2009. This tremor started in her right thumb. Once this test is tremor. She has been on Inderal for 5 years in my sleep for 2 years she denies memory loss, hallucinations, delusions, or depression. She has sleep study due to sleepiness reported. This was done in October 2013 and showed mild PLMs and mild sleep apnea. She was unable to tolerate CPAP. In January her MMSE was 29, clock drawing was 4, animal fluency was 20.   Her Past Medical History Is Significant For: Past Medical History:  Diagnosis Date  . Anxiety   . Borderline diabetic    08-2014  . Depression   . Insomnia with sleep apnea, unspecified   . Other and  unspecified hyperlipidemia   . Parkinson's disease ([place])   . Sleep disorder   . Unspecified essential hypertension     Her Past Surgical History Is Significant For: Past Surgical History:  Procedure Laterality Date  . CATARACT EXTRACTION Bilateral     Her Family History Is Significant For: Family History  Problem Relation Age of Onset  . Heart failure Mother   . Heart attack Father   . Brain cancer Sister   . Psychiatric Illness Sister   . Stroke Sister   . Diabetes Brother     Her Social History Is Significant For: Social History   Social History  . Marital status: Married    Spouse name: Joe  . Number of children: 2  . Years of education: College   Occupational History  . Retired     Materials engineer   Social History Main Topics  . Smoking status: Never Smoker  . Smokeless tobacco: Never Used  . Alcohol use No  . Drug use: No  . Sexual activity: Not Asked   Other Topics Concern  . None   Social History Narrative   Patient lives at home with her husband [name]. Patient is a homemaker.   Right handed.   One cup of coffee daily.    Her Allergies Are:  Allergies   Allergen Reactions  . Phenobarbital Other (See Comments)    Overall restlessness  :   Her Current Medications Are:  Outpatient Encounter Prescriptions as of 11/16/2015  Medication Sig  . CALCIUM ASCORBATE PO Take 600 mg by mouth daily.   . carbidopa-levodopa (SINEMET CR) 50-200 MG tablet Take 1 tablet by mouth at bedtime.  . carbidopa-levodopa (SINEMET IR) 25-100 MG tablet Take 1 tablet by mouth 5 (five) times daily.  . clopidogrel (PLAVIX) 75 MG tablet Take 75 mg by mouth daily.  . Cyanocobalamin (B-12) 2500 MCG TABS Take by mouth.  . Ferrous Sulfate (SLOW FE PO) Take 1 tablet by mouth.  . hydrochlorothiazide (HYDRODIURIL) 12.5 MG tablet Take 25 mg by mouth daily.  . Melatonin 3 MG TABS Take by mouth.  . pantoprazole (PROTONIX) 20 MG tablet Take 40 mg by mouth daily.   . propranolol (INDERAL) 80 MG tablet Take 80 mg by mouth daily.   Marland Kitchen SIMVASTATIN PO Take 20 mg by mouth daily.   Marland Kitchen venlafaxine (EFFEXOR) 37.5 MG tablet Take 37.5 mg by mouth 2 (two) times daily.   No facility-administered encounter medications on file as of 11/16/2015.   :  Review of Systems:  Out of a complete 14 point review of systems, all are reviewed and negative with the exception of these symptoms as listed below: Review of Systems  Neurological:       Patient is requesting refill of Flexeril, last prescribed by Tiffany Rubin NP.  At last visit, Sinemet was increased to 5 times a day, patient has trouble remembering to take the 5th tab.  Patient has seen a psychologist on a regular basis, Demetrios Isaacs. Patient did see Dr. Casimiro Ramirez once and was told to follow up with Demetrios Isaacs.     Objective:  Neurologic Exam  Physical Exam Physical Examination:   Vitals:   11/16/15 1045  BP: 132/80  Pulse: 70  Resp: 16     General Examination: The patient is a very pleasant 82 y.o. female in no acute distress. She is very well dressed. She is in good spirits today.   HEENT: Normocephalic, atraumatic,  pupils are equal, round and reactive to light  and accommodation. Funduscopic exam is normal with sharp disc margins noted. She is status post bilateral cataract repairs. Extraocular tracking shows mild to moderate saccadic breakdown. She has no nystagmus. She has a mild decrease in eye blink rate and mild facial masking. Hearing is intact. Speech is mildly hypophonic. She has an intermittent lower lip and jaw tremor, unchanged. Neck is mildly rigid. Oropharynx exam shows mild to moderate mouth dryness, otherwise fine. Tongue protrudes centrally and palate elevates symmetrically. One spot of skin cancerous lesion, dermatology follows.  Chest: is clear to auscultation without wheezing, rhonchi or crackles noted. Precancerous lesion cryosurgery recently on decollete area.  Heart: sounds are regular and normal without murmurs, rubs or gallops noted.   Abdomen: is soft, non-tender and non-distended with normal bowel sounds appreciated on auscultation.  Extremities: There is 1+ pitting edema in the distal lower extremities bilaterally. Pedal pulses are intact.   Skin: is warm and dry with chronic stasis type changes and mild discoloration of the distal lower extremities and feet. Age-related changes are noted on the skin.   Musculoskeletal: exam reveals no obvious joint deformities, tenderness, joint swelling or erythema.  Neurologically:  Mental status: The patient is awake and alert, paying good  attention. She is able to provide the history. Her husband tries to add info. The patient's memory, attention, language and knowledge seemed well preserved. She may have mild bradyphrenia.    Cranial nerves are as described above under HEENT exam. In addition, shoulder shrug is normal with slightly unequal shoulder height noted, right higher than left.   Motor exam: Normal bulk, and strength for age is noted. There are no dyskinesias noted. She has an intermittent mild to at times moderate resting tremor in  the right upper extremity. She has no overt tremor otherwise. Stable finding. Fine motor skills are moderately impaired with finger taps, hand movements and rapid alternating patting in the right upper extremity and mild to moderately impaired in the left upper extremity. Foot agility and foot taps are moderately impaired on the right and mildly so on the left. She stands up with mild difficulty but does not have to push herself up. Her posture is mildly stooped. She has a slight tilt to the right. She walks with decreased stride length and mildly decreased pace and decreased arm swing on the left and near absent arm swing on the right. She turns in 3 steps. Reflexes are trace in the lower extremities and 1+ in the upper extremities. Sensory exam is intact to light touch. Tandem walk is not possible for her.   Assessment and Plan:   In summary, Tiffany Ramirez is a very pleasant 82 year old female with an underlying medical history of TIA, hypertension, hyperlipidemia, and overweight state, history of complex sleep apnea and PLMD, with a remote history of restless leg syndrome, who presents for followup consultation of her Parkinson's disease. She has a 6 year history of right-sided predominant Parkinson's disease, was on lower dose levodopa for quite some time and managed well for several years. She had an interim increase in her stress level, anxiety and depression, all of which have improved, particularly after she spent some time with her daughter up [place] in [place]. She is on low-dose Effexor long-acting 37.5 mg daily at this time and has recently seen Dr. Casimiro Ramirez. She has been seeing a counselor about 1/mo with good results. Has been on propranolol long-acting, 80 mg once daily for years. She has been on C/L 4 times a day  and feels stable. We had increased it to 1 pill 5 times a day but she is able to get by well enough with 4 pills a day and he takes the CR at night. Her exam is indeed stable, we  will continue with his current regimen, she requested a refill on Flexeril which she has been taking at night for muscle spasms but it helps her sleep also. She had evidence on her sleep study years ago of PLMS, I believe it was started at the time. Nevertheless, I did advise her that Flexeril can be sedating and can make her balance worse and sleepiness can cause problems with balance and mentation at night. She is advised to be cautious with this and take it as needed. I did refill it at this time, I also placed refills on her Sinemet and Sinemet CR. I suggested a follow-up in 4 months, she can see Tiffany Ramirez at the time and I can see her back after that. I answered all her questions today and the patient and her husband were in agreement.  I spent 25 minutes in total face-to-face time with the patient, more than 50% of which was spent in counseling and coordination of care, reviewing test results, reviewing medication and discussing or reviewing the diagnosis of PD, its prognosis and treatment options.

## 2015-11-16 NOTE — Patient Instructions (Addendum)
I will refill your flexeril for your your muscle spasms, please keep in mind, that it can make you sleepy and off balance. Side effects may include dizziness, sleepiness, mouth dryness, feeling off balance, so please be mindful. You are not able to drive after taking it!   Please have your family monitor your driving. I would suggest at this point only local roads, familiar routes, no nighttime and no highway driving.   You are at fall risk due to Parkinson's and age.

## 2015-11-29 ENCOUNTER — Ambulatory Visit: Payer: Medicare Other | Admitting: Neurology

## 2015-12-28 ENCOUNTER — Encounter (INDEPENDENT_AMBULATORY_CARE_PROVIDER_SITE_OTHER): Payer: Self-pay | Admitting: Vascular Surgery

## 2015-12-28 ENCOUNTER — Ambulatory Visit (INDEPENDENT_AMBULATORY_CARE_PROVIDER_SITE_OTHER): Payer: Medicare Other | Admitting: Vascular Surgery

## 2015-12-28 VITALS — BP 120/69 | HR 65 | Resp 16 | Ht 66.0 in | Wt 187.6 lb

## 2015-12-28 DIAGNOSIS — I10 Essential (primary) hypertension: Secondary | ICD-10-CM | POA: Diagnosis not present

## 2015-12-28 DIAGNOSIS — G20 Parkinson's disease: Secondary | ICD-10-CM | POA: Diagnosis not present

## 2015-12-28 DIAGNOSIS — E785 Hyperlipidemia, unspecified: Secondary | ICD-10-CM

## 2015-12-28 DIAGNOSIS — M7989 Other specified soft tissue disorders: Secondary | ICD-10-CM | POA: Diagnosis not present

## 2015-12-28 NOTE — Progress Notes (Signed)
Patient ID: Tiffany Ramirez, female   DOB: 02-22-1928, 82 y.o.   MRN: [id number]  Chief Complaint  Patient presents with  . New Patient (Initial Visit)    HPI Tiffany Ramirez is a 82 y.o. female.  I am asked to see the patient by Dr. Emily Filbert for evaluation of bilateral lower extremity edema.  The patient reports that her legs turned purple or black they became so swollen. She has had an increase in her diuretic therapy which has improved the discoloration and swelling in her legs, but not entirely resolve that. There was no clear inciting event or causative factor that started the symptoms.  The left leg is more severly affected. The patient elevates the legs for relief. The pain is described as heaviness and aching. The symptoms are generally most severe in the evening, particularly when they have been on their feet for long periods of time. She says that she walks 20 minutes a day and that has also helped her legs. She has difficulty getting compression stockings on and off due to her Parkinson's disease. She does not have ulceration or infection. She has no chest pain or shortness of breath. The patient has no previous history of deep venous thrombosis or superficial thrombophlebitis to their knowledge.     Past Medical History:  Diagnosis Date  . Anxiety   . Borderline diabetic    08-2014  . Depression   . Insomnia with sleep apnea, unspecified   . Other and unspecified hyperlipidemia   . Parkinson's disease ([place])   . Skin cancer, basal cell   . Sleep disorder   . Unspecified essential hypertension     Past Surgical History:  Procedure Laterality Date  . CATARACT EXTRACTION Bilateral     Family History  Problem Relation Age of Onset  . Heart failure Mother   . Hyperlipidemia Mother   . Heart attack Father   . Diabetes Father   . Brain cancer Sister   . Psychiatric Illness Sister   . Stroke Sister   . Deep vein thrombosis Sister   . Diabetes Brother       Social History Social History  Substance Use Topics  . Smoking status: Never Smoker  . Smokeless tobacco: Never Used  . Alcohol use No  No IV drug use  Allergies  Allergen Reactions  . Phenobarbital Other (See Comments)    Overall restlessness    Current Outpatient Prescriptions  Medication Sig Dispense Refill  . CALCIUM ASCORBATE PO Take 600 mg by mouth daily.     . Calcium Carbonate-Vitamin D 600-400 MG-UNIT tablet Take by mouth.    . carbidopa-levodopa (SINEMET CR) 50-200 MG tablet Take 1 tablet by mouth at bedtime. 90 tablet 3  . carbidopa-levodopa (SINEMET IR) 25-100 MG tablet Take 1 tablet by mouth 4 (four) times daily. 360 tablet 3  . clopidogrel (PLAVIX) 75 MG tablet Take 75 mg by mouth daily.    . Cyanocobalamin (B-12) 2500 MCG TABS Take by mouth.    . cyclobenzaprine (FLEXERIL) 10 MG tablet Take 1 tablet (10 mg total) by mouth at bedtime as needed for muscle spasms. 90 tablet 3  . Ferrous Sulfate (SLOW FE PO) Take 1 tablet by mouth.    . hydrochlorothiazide (HYDRODIURIL) 12.5 MG tablet Take 25 mg by mouth daily.    . Melatonin 3 MG TABS Take by mouth.    . pantoprazole (PROTONIX) 20 MG tablet Take 40 mg by mouth daily.     Marland Kitchen  propranolol (INDERAL) 80 MG tablet Take 80 mg by mouth daily.     Marland Kitchen SIMVASTATIN PO Take 20 mg by mouth daily.     Marland Kitchen venlafaxine (EFFEXOR) 37.5 MG tablet Take 37.5 mg by mouth 2 (two) times daily.     No current facility-administered medications for this visit.       REVIEW OF SYSTEMS (Negative unless checked)  Constitutional: [] Weight loss  [] Fever  [] Chills Cardiac: [] Chest pain   [] Chest pressure   [] Palpitations   [] Shortness of breath when laying flat   [] Shortness of breath at rest   [] Shortness of breath with exertion. Vascular:  [] Pain in legs with walking   [] Pain in legs at rest   [] Pain in legs when laying flat   [] Claudication   [] Pain in feet when walking  [] Pain in feet at rest  [] Pain in feet when laying flat   [] History of  DVT   [] Phlebitis   [x] Swelling in legs   [x] Varicose veins   [] Non-healing ulcers Pulmonary:   [] Uses home oxygen   [] Productive cough   [] Hemoptysis   [] Wheeze  [] COPD   [] Asthma Neurologic:  [] Dizziness  [] Blackouts   [] Seizures   [] History of stroke   [] History of TIA  [] Aphasia   [] Temporary blindness   [] Dysphagia   [] Weakness or numbness in arms   [] Weakness or numbness in legs Musculoskeletal:  [x] Arthritis   [] Joint swelling   [] Joint pain   [] Low back pain Hematologic:  [] Easy bruising  [] Easy bleeding   [] Hypercoagulable state   [] Anemic  [] Hepatitis Gastrointestinal:  [] Blood in stool   [] Vomiting blood  [] Gastroesophageal reflux/heartburn   [] Abdominal pain Genitourinary:  [] Chronic kidney disease   [] Difficult urination  [] Frequent urination  [] Burning with urination   [] Hematuria Skin:  [] Rashes   [] Ulcers   [] Wounds Psychological:  [x] History of anxiety   [x]  History of major depression.    Physical Exam BP 120/69   Pulse 65   Resp 16   Ht 5\' 6"  (1.676 m)   Wt 187 lb 9.6 oz (85.1 kg)   BMI 30.28 kg/m  Gen:  WD/WN, NAD Head: [place]/AT, No temporalis wasting.  Ear/Nose/Throat: Hearing grossly intact, dentition appears good Eyes: Sclera non-icteric. Conjunctiva clear Neck: Supple, no nuchal rigidity. Trachea midline Pulmonary:  Good air movement, no use of accessory muscles, respirations not labored.  Cardiac: RRR, No JVD Vascular: Varicosities diffuse and measuring up to 2-3 mm in the right lower extremity        Varicosities diffuse and measuring up to 2-3 mm in the left lower extremity Vessel Right Left  Radial Palpable Palpable  Ulnar Palpable Palpable  Brachial Palpable Palpable  Carotid Palpable, without bruit Palpable, without bruit  Aorta Not palpable N/A  Femoral Palpable Palpable  Popliteal Palpable Palpable  PT 1+ Palpable Trace Palpable  DP Palpable Palpable   Gastrointestinal: soft, non-tender/non-distended. No guarding/reflex. No masses, surgical  incisions, or scars. Musculoskeletal: M/S 5/5 throughout.   1 + RLE edema.  1-2 + LLE edema Neurologic: Sensation grossly intact in extremities.  Symmetrical.  No obvious tremor today. No focal deficits noted Psychiatric: Judgment intact, affect is slightly flat Dermatologic: No rashes or ulcers noted.  No cellulitis or open wounds. Lymph : No Cervical, Axillary, or Inguinal lymphadenopathy.   Radiology No results found.  Labs No results found for this or any previous visit (from the past 2160 hour(s)).  Assessment/Plan:  No problem-specific Assessment & Plan notes found for this encounter.    The patient  has symptoms consistent with chronic venous insufficiency. We discussed the natural history and treatment options for venous disease. I recommended the regular use of 20 - 30 mm Hg compression stockings, and recommended these today. She reports difficulty getting these on and off, and we discussed potentially using the zippered compression stockings which are easier in that regard. I recommended leg elevation and anti-inflammatories as needed for pain. I have also recommended a complete venous duplex to assess the venous system for reflux or thrombotic issues. This can be done at the patient's convenience. I will see the patient back after their duplex to assess the response to conservative management, and determine further treatment options.     Leotis Pain 12/28/2015, 10:11 AM   This note was created with Dragon medical transcription system.  Any errors from dictation are unintentional.

## 2015-12-28 NOTE — Assessment & Plan Note (Signed)
Difficulty using stockings due to her Parkinson's

## 2015-12-28 NOTE — Assessment & Plan Note (Signed)
lipid control important in reducing the progression of atherosclerotic disease. Continue statin therapy  

## 2015-12-28 NOTE — Patient Instructions (Signed)
Venous Stasis or Chronic Venous Insufficiency Chronic venous insufficiency, also called venous stasis, is a condition that affects the veins in the legs. The condition prevents blood from being pumped through these veins effectively. Blood may no longer be pumped effectively from the legs back to the heart. This condition can range from mild to severe. With proper treatment, you should be able to continue with an active life. CAUSES  Chronic venous insufficiency occurs when the vein walls become stretched, weakened, or damaged or when valves within the vein are damaged. Some common causes of this include:  High blood pressure inside the veins (venous hypertension).  Increased blood pressure in the leg veins from long periods of sitting or standing.  A blood clot that blocks blood flow in a vein (deep vein thrombosis).  Inflammation of a superficial vein (phlebitis) that causes a blood clot to form. RISK FACTORS Various things can make you more likely to develop chronic venous insufficiency, including:  Family history of this condition.  Obesity.  Pregnancy.  Sedentary lifestyle.  Smoking.  Jobs requiring long periods of standing or sitting in one place.  Being a certain age. Women in their 40s and 50s and men in their 70s are more likely to develop this condition. SIGNS AND SYMPTOMS  Symptoms may include:   Varicose veins.  Skin breakdown or ulcers.  Reddened or discolored skin on the leg.  Brown, smooth, tight, and painful skin just above the ankle, usually on the inside surface (lipodermatosclerosis).  Swelling. DIAGNOSIS  To diagnose this condition, your health care provider will take a medical history and do a physical exam. The following tests may be ordered to confirm the diagnosis:  Duplex ultrasound-A procedure that produces a picture of a blood vessel and nearby organs and also provides information on blood flow through the blood vessel.  Plethysmography-A  procedure that tests blood flow.  A venogram, or venography-A procedure used to look at the veins using X-ray and dye. TREATMENT The goals of treatment are to help you return to an active life and to minimize pain or disability. Treatment will depend on the severity of the condition. Medical procedures may be needed for severe cases. Treatment options may include:   Use of compression stockings. These can help with symptoms and lower the chances of the problem getting worse, but they do not cure the problem.  Sclerotherapy-A procedure involving an injection of a material that "dissolves" the damaged veins. Other veins in the network of blood vessels take over the function of the damaged veins.  Surgery to remove the vein or cut off blood flow through the vein (vein stripping or laser ablation surgery).  Surgery to repair a valve. HOME CARE INSTRUCTIONS   Wear compression stockings as directed by your health care provider.  Only take over-the-counter or prescription medicines for pain, discomfort, or fever as directed by your health care provider.  Follow up with your health care provider as directed. SEEK MEDICAL CARE IF:   You have redness, swelling, or increasing pain in the affected area.  You see a red streak or line that extends up or down from the affected area.  You have a breakdown or loss of skin in the affected area, even if the breakdown is small.  You have an injury to the affected area. SEEK IMMEDIATE MEDICAL CARE IF:   You have an injury and open wound in the affected area.  Your pain is severe and does not improve with medicine.  You have   sudden numbness or weakness in the foot or ankle below the affected area, or you have trouble moving your foot or ankle.  You have a fever or persistent symptoms for more than 2-3 days.  You have a fever and your symptoms suddenly get worse. MAKE SURE YOU:   Understand these instructions.  Will watch your condition.  Will  get help right away if you are not doing well or get worse. This information is not intended to replace advice given to you by your health care provider. Make sure you discuss any questions you have with your health care provider. Document Released: 05/15/2006 Document Revised: 10/30/2012 Document Reviewed: 09/16/2012 Elsevier Interactive Patient Education  2017 Elsevier Inc.  

## 2015-12-28 NOTE — Assessment & Plan Note (Signed)
blood pressure control important in reducing the progression of atherosclerotic disease. On appropriate oral medications.  

## 2015-12-28 NOTE — Assessment & Plan Note (Signed)
Multiple potential causes discussed. See treatment plan and workup as below.

## 2016-02-15 DIAGNOSIS — Z0000 Encounter for general adult medical examination without abnormal findings: Secondary | ICD-10-CM | POA: Insufficient documentation

## 2016-02-17 ENCOUNTER — Encounter: Payer: Self-pay | Admitting: Nurse Practitioner

## 2016-02-17 ENCOUNTER — Ambulatory Visit (INDEPENDENT_AMBULATORY_CARE_PROVIDER_SITE_OTHER): Payer: Medicare Other | Admitting: Nurse Practitioner

## 2016-02-17 VITALS — BP 156/76 | HR 61 | Ht 66.0 in | Wt 186.6 lb

## 2016-02-17 DIAGNOSIS — G20A1 Parkinson's disease without dyskinesia, without mention of fluctuations: Secondary | ICD-10-CM

## 2016-02-17 DIAGNOSIS — G479 Sleep disorder, unspecified: Secondary | ICD-10-CM

## 2016-02-17 DIAGNOSIS — G20 Parkinson's disease: Secondary | ICD-10-CM | POA: Diagnosis not present

## 2016-02-17 NOTE — Patient Instructions (Addendum)
Continue Sinemet 25 100 4 times daily Continue Sinemet CR 50 200 at bedtime Continue Flexeril as needed  Continue walking for exercise increase in 10 min increments Follow up with Dr. Rexene Alberts in 4 months

## 2016-02-17 NOTE — Progress Notes (Addendum)
[facility]  PATIENT: Tiffany Ramirez DOB: March 31, 1928   REASON FOR VISIT: Follow-up for Parkinson's disease HISTORY FROM: Patient and husband    HISTORY OF PRESENT ILLNESS:Tiffany Ramirez is a very pleasant 83 year old right-handed woman with an underlying medical history of TIA, hypertension, hyperlipidemia, and overweight state, history of complex sleep apnea and PLMD (with a remote history of RLS), who presents for followup consultation of her Parkinson's disease. She's accompanied by her husband today. I last saw her on 07/20/2015, at which time she was taking Sinemet about 3 hours apart around 7, 10, 1 PM and 4 PM. She was doing fairly well, mood was stable, she was 7 low-dose long-acting Effexor 37.5 mg daily. No recent falls reported. She was taking her Sinemet CR at 9 PM and no longer any melatonin as she felt she was sleeping better. We mutually agreed to make a referral to geriatric psychiatry.   Today, 10/24/2017SA: She reports doing okay, taking C/L 4 times a day, at 7 AM, 11 AM, 3 PM, and 6 PM and the CR at night. In the interim, I referred her to another psychiatrist and I received a phone call from [facility] that they were trying to get in touch with them for scheduling. Patient reports, that she saw Dr. Casimiro Needle once and he recommended she FU with her counselor, sees her about 1/month. Unfortunately, she did have a fall a couple of months ago. She was walking with something in her hand and bumped into the side of the building, she twisted or injured her left ankle but had no sustained pain or injury, she also fell on the ground and may have hit the back of her head but had no loss of consciousness, no laceration, no headache, no mental status changes and feels fine at this time. She still drives without problems, usually just local roads and no highway driving at this time. Tries to walk about 20 minutes each day.  UPDATE 01/25/2018[name],  83 year old female returns for follow-up with a 6-1/2 year history of Parkinson's disease. She denies any freezing spells or stiffness. She did have 2 falls since last seen both of them occurred in the house with no apparent injury she denies any swallowing difficulty no problems with bowel or bladder control. She is currently on Sinemet 4 times a day and CR at night. She denies any wearing off phenomena. She denies any hallucinations or memory loss. She returns for reevaluation. She claims she still walks 20-30 minutes a day in the house.   REVIEW OF SYSTEMS: Full 14 system review of systems performed and notable only for those listed, all others are neg:  Constitutional: neg  Cardiovascular: neg Ear/Nose/Throat: neg  Skin: neg Eyes: neg Respiratory: neg Gastroitestinal: neg  Hematology/Lymphatic: neg  Endocrine: neg Musculoskeletal:neg Allergy/Immunology: neg Neurological: Tremor Psychiatric: neg Sleep : Insomnia   ALLERGIES: Allergies  Allergen Reactions  . Phenobarbital Other (See Comments)    Overall restlessness    HOME MEDICATIONS: Outpatient Medications Prior to Visit  Medication Sig Dispense Refill  . CALCIUM ASCORBATE PO Take 600 mg by mouth daily.     . Calcium Carbonate-Vitamin D 600-400 MG-UNIT tablet Take by mouth.    . carbidopa-levodopa (SINEMET CR) 50-200 MG tablet Take 1 tablet by mouth at bedtime. 90 tablet 3  . carbidopa-levodopa (SINEMET IR) 25-100 MG tablet Take 1 tablet by mouth 4 (four) times daily. 360 tablet 3  . clopidogrel (PLAVIX) 75 MG tablet Take 75 mg by mouth  daily.    . Cyanocobalamin (B-12) 2500 MCG TABS Take by mouth.    . cyclobenzaprine (FLEXERIL) 10 MG tablet Take 1 tablet (10 mg total) by mouth at bedtime as needed for muscle spasms. 90 tablet 3  . Ferrous Sulfate (SLOW FE PO) Take 1 tablet by mouth.    . hydrochlorothiazide (HYDRODIURIL) 12.5 MG tablet Take 25 mg by mouth daily.    . pantoprazole (PROTONIX) 20 MG tablet Take 40 mg by  mouth daily.     . propranolol (INDERAL) 80 MG tablet Take 80 mg by mouth daily.     Marland Kitchen SIMVASTATIN PO Take 20 mg by mouth daily.     Marland Kitchen venlafaxine (EFFEXOR) 37.5 MG tablet Take 37.5 mg by mouth 2 (two) times daily.    . Melatonin 3 MG TABS Take by mouth.     No facility-administered medications prior to visit.     PAST MEDICAL HISTORY: Past Medical History:  Diagnosis Date  . Anxiety   . Borderline diabetic    08-2014  . Depression   . Insomnia with sleep apnea, unspecified   . Other and unspecified hyperlipidemia   . Parkinson's disease ([place])   . Skin cancer, basal cell   . Sleep disorder   . Unspecified essential hypertension     PAST SURGICAL HISTORY: Past Surgical History:  Procedure Laterality Date  . CATARACT EXTRACTION Bilateral     FAMILY HISTORY: Family History  Problem Relation Age of Onset  . Heart failure Mother   . Hyperlipidemia Mother   . Heart attack Father   . Diabetes Father   . Brain cancer Sister   . Psychiatric Illness Sister   . Stroke Sister   . Deep vein thrombosis Sister   . Diabetes Brother     SOCIAL HISTORY: Social History   Social History  . Marital status: Married    Spouse name: Joe  . Number of children: 2  . Years of education: College   Occupational History  . Retired     Materials engineer   Social History Main Topics  . Smoking status: Never Smoker  . Smokeless tobacco: Never Used  . Alcohol use No  . Drug use: No  . Sexual activity: Not on file   Other Topics Concern  . Not on file   Social History Narrative   Patient lives at home with her husband [name]. Patient is a homemaker.   Right handed.   One cup of coffee daily.     PHYSICAL EXAM  Vitals:   02/17/16 1240  BP: (!) 156/76  Pulse: 61  Weight: 186 lb 9.6 oz (84.6 kg)  Height: 5\' 6"  (1.676 m)   Body mass index is 30.12 kg/m.  Generalized: Well developed, in no acute distress , mild masking of the face Head: normocephalic and atraumatic,. Oropharynx  benign  Neck: Supple, no carotid bruits  Cardiac: Regular rate rhythm, no murmur  Musculoskeletal: No deformity   Neurological examination   Mentation: Alert oriented to time, place, history taking. Attention span and concentration appropriate. Recent and remote memory intact.  Follows all commands speech mildly hypophonic and language fluent.   Cranial nerve II-XII: Pupils were equal round reactive to light extraocular movements were full, visual field were full on confrontational test. Decreased blink Facial sensation and strength were normal. She has intermittent lip and jaw tremor hearing was intact to finger rubbing bilaterally. Uvula tongue midline. head turning and shoulder shrug were normal and symmetric.Tongue protrusion into cheek strength was  normal. Motor: normal bulk and tone, full strength in the BUE, BLE, no dyskinesias noted, mild right upper extremity tremor at rest no other tremor seen. Fine motor skills mildly impaired with finger taps Sensory: normal and symmetric to light touch, Coordination: finger-nose-finger, heel-to-shin bilaterally, no dysmetria Reflexes: 1+ upper lower and symmetric, plantar responses were flexor bilaterally. Gait and Station: Rising up from seated position without assistance, stooped posture, ambulated 210 feet in the hospital in 60 seconds, no decreased arm swing no difficulty with turns  DIAGNOSTIC DATA (LABS, IMAGING, TESTING) - I reviewed patient records, labs, notes, testing and imaging myself where available.  No results found for: WBC, HGB, HCT, MCV, PLT  ASSESSMENT AND PLAN  83 y.o. year old female  has a past medical history of 6.5  Years of right-sided predominant Parkinson's disease currently well-controlled on Sinemet.  She is on low-dose Effexor long-acting 37.5 mg daily and is followed by Dr. Casimiro Needle.  Flexeril which she has been taking at night for muscle spasms works well and  helps her sleep also. She had evidence on her sleep study  years ago of PLMS,  PLAN: Continue Sinemet 25 100 4 times daily Continue Sinemet CR 50 200 at bedtime Continue Flexeril as needed  Continue walking for exercise increase in 10 min increments Follow up with Dr. Rexene Alberts in 4 months I spent 25 min in total face to face time with the patient more than 50% of which was spent counseling and coordination of care, reviewing test results reviewing medications and discussing and reviewing the diagnosis of PD  and answering questions Dennie Bible, [facility], [facility], APRN  [facility] [address] [place], [place] [address] [phone]  I reviewed the above note and documentation by the Nurse Practitioner and agree with the history, physical exam, assessment and plan as outlined above. I was immediately available for face-to-face consultation. Star Age, MD, PhD [facility])

## 2016-03-07 ENCOUNTER — Encounter (INDEPENDENT_AMBULATORY_CARE_PROVIDER_SITE_OTHER): Payer: Self-pay | Admitting: Vascular Surgery

## 2016-03-07 ENCOUNTER — Ambulatory Visit (INDEPENDENT_AMBULATORY_CARE_PROVIDER_SITE_OTHER): Payer: Medicare Other | Admitting: Vascular Surgery

## 2016-03-07 ENCOUNTER — Ambulatory Visit (INDEPENDENT_AMBULATORY_CARE_PROVIDER_SITE_OTHER): Payer: Medicare Other

## 2016-03-07 VITALS — BP 154/76 | HR 68 | Resp 16 | Wt 183.0 lb

## 2016-03-07 DIAGNOSIS — M7989 Other specified soft tissue disorders: Secondary | ICD-10-CM | POA: Diagnosis not present

## 2016-03-07 DIAGNOSIS — E785 Hyperlipidemia, unspecified: Secondary | ICD-10-CM

## 2016-03-07 DIAGNOSIS — I10 Essential (primary) hypertension: Secondary | ICD-10-CM | POA: Diagnosis not present

## 2016-03-07 NOTE — Assessment & Plan Note (Signed)
Her swelling is about the same and the stasis changes in the feet and ankle remains significant. Her venous duplex today reveals no evidence of deep venous thrombosis or superficial thrombophlebitis. No venous reflux was identified in either lower extremity. I believe she has some degree of lymphedema from chronic scarring of the lymphatic channels. I have offered her a lymphedema pump as I think this would help her chronic swelling. She has declined this today. She desires to just call our office if he gets worse.

## 2016-03-07 NOTE — Assessment & Plan Note (Signed)
lipid control important in reducing the progression of atherosclerotic disease. Continue statin therapy  

## 2016-03-07 NOTE — Progress Notes (Signed)
MRN : [id number]  Tiffany Ramirez is a 83 y.o. (1928/01/28) female who presents with chief complaint of  Chief Complaint  Patient presents with  . Follow-up  .  History of Present Illness: Patient returns today in follow up of Lower extremity swelling and discoloration. She says her legs are basically the same as they were her initial visit. She denies any new ulceration, infection, or major changes. Her swelling is about the same and the stasis changes in the feet and ankle remains significant. Her venous duplex today reveals no evidence of deep venous thrombosis or superficial thrombophlebitis. No venous reflux was identified in either lower extremity.        Past Medical History:  Diagnosis Date  . Anxiety   . Borderline diabetic    08-2014  . Depression   . Insomnia with sleep apnea, unspecified   . Other and unspecified hyperlipidemia   . Parkinson's disease ([place])   . Skin cancer, basal cell   . Sleep disorder   . Unspecified essential hypertension     Past Surgical History:  Procedure Laterality Date  . CATARACT EXTRACTION Bilateral          Family History  Problem Relation Age of Onset  . Heart failure Mother   . Hyperlipidemia Mother   . Heart attack Father   . Diabetes Father   . Brain cancer Sister   . Psychiatric Illness Sister   . Stroke Sister   . Deep vein thrombosis Sister   . Diabetes Brother      Social History     Social History  Substance Use Topics  . Smoking status: Never Smoker  . Smokeless tobacco: Never Used  . Alcohol use No  No IV drug use       Allergies  Allergen Reactions  . Phenobarbital Other (See Comments)    Overall restlessness          Current Outpatient Prescriptions  Medication Sig Dispense Refill  . CALCIUM ASCORBATE PO Take 600 mg by mouth daily.     . Calcium Carbonate-Vitamin D 600-400 MG-UNIT tablet Take by mouth.    . carbidopa-levodopa (SINEMET CR) 50-200 MG tablet  Take 1 tablet by mouth at bedtime. 90 tablet 3  . carbidopa-levodopa (SINEMET IR) 25-100 MG tablet Take 1 tablet by mouth 4 (four) times daily. 360 tablet 3  . clopidogrel (PLAVIX) 75 MG tablet Take 75 mg by mouth daily.    . Cyanocobalamin (B-12) 2500 MCG TABS Take by mouth.    . cyclobenzaprine (FLEXERIL) 10 MG tablet Take 1 tablet (10 mg total) by mouth at bedtime as needed for muscle spasms. 90 tablet 3  . Ferrous Sulfate (SLOW FE PO) Take 1 tablet by mouth.    . hydrochlorothiazide (HYDRODIURIL) 12.5 MG tablet Take 25 mg by mouth daily.    . Melatonin 3 MG TABS Take by mouth.    . pantoprazole (PROTONIX) 20 MG tablet Take 40 mg by mouth daily.     . propranolol (INDERAL) 80 MG tablet Take 80 mg by mouth daily.     Marland Kitchen SIMVASTATIN PO Take 20 mg by mouth daily.     Marland Kitchen venlafaxine (EFFEXOR) 37.5 MG tablet Take 37.5 mg by mouth 2 (two) times daily.     No current facility-administered medications for this visit.       REVIEW OF SYSTEMS (Negative unless checked)  Constitutional: [] Weight loss  [] Fever  [] Chills Cardiac: [] Chest pain   [] Chest pressure   [] Palpitations   []   Shortness of breath when laying flat   [] Shortness of breath at rest   [] Shortness of breath with exertion. Vascular:  [] Pain in legs with walking   [] Pain in legs at rest   [] Pain in legs when laying flat   [] Claudication   [] Pain in feet when walking  [] Pain in feet at rest  [] Pain in feet when laying flat   [] History of DVT   [] Phlebitis   [x] Swelling in legs   [x] Varicose veins   [] Non-healing ulcers Pulmonary:   [] Uses home oxygen   [] Productive cough   [] Hemoptysis   [] Wheeze  [] COPD   [] Asthma Neurologic:  [] Dizziness  [] Blackouts   [] Seizures   [] History of stroke   [] History of TIA  [] Aphasia   [] Temporary blindness   [] Dysphagia   [] Weakness or numbness in arms   [] Weakness or numbness in legs Musculoskeletal:  [x] Arthritis   [] Joint swelling   [] Joint pain   [] Low back pain Hematologic:   [] Easy bruising  [] Easy bleeding   [] Hypercoagulable state   [] Anemic  [] Hepatitis Gastrointestinal:  [] Blood in stool   [] Vomiting blood  [] Gastroesophageal reflux/heartburn   [] Abdominal pain Genitourinary:  [] Chronic kidney disease   [] Difficult urination  [] Frequent urination  [] Burning with urination   [] Hematuria Skin:  [] Rashes   [] Ulcers   [] Wounds Psychological:  [x] History of anxiety   [x]  History of major depression.    Physical Exam BP 120/69   Pulse 65   Resp 16   Ht 5\' 6"  (1.676 m)   Wt 187 lb 9.6 oz (85.1 kg)   BMI 30.28 kg/m  Gen:  WD/WN, NAD Head: [place]/AT, No temporalis wasting.  Ear/Nose/Throat: Hearing grossly intact, dentition appears good Eyes: Sclera non-icteric. Conjunctiva clear Neck: Supple, no nuchal rigidity. Trachea midline Pulmonary:  Good air movement, no use of accessory muscles, respirations not labored.  Cardiac: RRR, No JVD Vascular: Varicosities diffuse and measuring up to 2-3 mm in the right lower extremity                   Varicosities diffuse and measuring up to 2-3 mm in the left lower extremity Vessel Right Left  Radial Palpable Palpable  Ulnar Palpable Palpable  Brachial Palpable Palpable  Carotid Palpable, without bruit Palpable, without bruit  Aorta Not palpable N/A  Femoral Palpable Palpable  Popliteal Palpable Palpable  PT 1+ Palpable Trace Palpable  DP Palpable Palpable   Gastrointestinal: soft, non-tender/non-distended. No guarding/reflex. No masses, surgical incisions, or scars. Musculoskeletal: M/S 5/5 throughout.   1 + RLE edema.  1-2 + LLE edema. Significant stasis dermatitis present in both lower extremities Neurologic: Sensation grossly intact in extremities.  Symmetrical.  No obvious tremor today. No focal deficits noted Psychiatric: Judgment intact, affect is slightly flat Dermatologic: No rashes or ulcers noted.  No cellulitis or open wounds. Lymph : No Cervical, Axillary, or Inguinal lymphadenopathy.    Labs No  results found for this or any previous visit (from the past 2160 hour(s)).  Radiology No results found.    Assessment/Plan  Essential hypertension blood pressure control important in reducing the progression of atherosclerotic disease. On appropriate oral medications.   HLD (hyperlipidemia) lipid control important in reducing the progression of atherosclerotic disease. Continue statin therapy   Swelling of limb Her swelling is about the same and the stasis changes in the feet and ankle remains significant. Her venous duplex today reveals no evidence of deep venous thrombosis or superficial thrombophlebitis. No venous reflux was identified in either lower extremity.  I believe she has some degree of lymphedema from chronic scarring of the lymphatic channels. I have offered her a lymphedema pump as I think this would help her chronic swelling. She has declined this today. She desires to just call our office if he gets worse.    Leotis Pain, MD  03/07/2016 11:42 AM    This note was created with Dragon medical transcription system.  Any errors from dictation are purely unintentional

## 2016-03-07 NOTE — Assessment & Plan Note (Signed)
blood pressure control important in reducing the progression of atherosclerotic disease. On appropriate oral medications.  

## 2016-03-28 ENCOUNTER — Telehealth: Payer: Self-pay | Admitting: Neurology

## 2016-03-28 DIAGNOSIS — G20 Parkinson's disease: Secondary | ICD-10-CM

## 2016-03-28 NOTE — Telephone Encounter (Signed)
Pt's daughter called said pt is taking  carbidopa-levodopa (SINEMET IR) 25-100 MG tablet as directed and and additional 1/2 tablet with it she functions very well. She does not have the shaking of the hand, hold things better, spirits are better, plays bridge better. She is requesting 1 more refill forcarbidopa-levodopa (SINEMET IR) 25-100 MG tablet sent to [facility]. Please call to clarify.

## 2016-03-30 MED ORDER — CARBIDOPA-LEVODOPA 25-100 MG PO TABS
1.5000 | ORAL_TABLET | Freq: Four times a day (QID) | ORAL | 3 refills | Status: DC
Start: 2016-03-30 — End: 2017-02-15

## 2016-03-30 NOTE — Telephone Encounter (Signed)
Rx sent to [facility] for C/L IR 1.5 mg qid.

## 2016-03-30 NOTE — Telephone Encounter (Signed)
I spoke to daughter. She states that patient has increased her Sinemet IR 25-100 to 1.5 tabs 4 times a day and is doing well on it.  She asks if we can send in a new Rx to [facility] reflecting this new order. Ok to send in?

## 2016-06-21 ENCOUNTER — Ambulatory Visit: Payer: Medicare Other | Admitting: Neurology

## 2016-08-14 ENCOUNTER — Ambulatory Visit (INDEPENDENT_AMBULATORY_CARE_PROVIDER_SITE_OTHER): Payer: Medicare Other | Admitting: Neurology

## 2016-08-14 ENCOUNTER — Encounter: Payer: Self-pay | Admitting: Neurology

## 2016-08-14 VITALS — BP 138/82 | HR 72 | Wt 160.0 lb

## 2016-08-14 DIAGNOSIS — F418 Other specified anxiety disorders: Secondary | ICD-10-CM | POA: Diagnosis not present

## 2016-08-14 DIAGNOSIS — R600 Localized edema: Secondary | ICD-10-CM

## 2016-08-14 DIAGNOSIS — K5909 Other constipation: Secondary | ICD-10-CM | POA: Diagnosis not present

## 2016-08-14 DIAGNOSIS — G20 Parkinson's disease: Secondary | ICD-10-CM | POA: Diagnosis not present

## 2016-08-14 MED ORDER — CARBIDOPA-LEVODOPA ER 50-200 MG PO TBCR: 1.0000 | EXTENDED_RELEASE_TABLET | Freq: Every day | ORAL | 3 refills | Status: DC

## 2016-08-14 NOTE — Patient Instructions (Signed)
We will keep your sinemet at 1.5 pills 4 times a day and your CR at night.   I will see you back in about 6 months.

## 2016-08-14 NOTE — Progress Notes (Signed)
Subjective:    Patient ID: Tiffany Ramirez is a 83 y.o. female.  HPI     Interim history:  Ms. Tiffany Ramirez is a very pleasant 83 year old right-handed woman with an underlying medical history of TIA, hypertension, hyperlipidemia, and overweight state, history of complex sleep apnea and PLMD (with a remote history of RLS), who presents for followup consultation of her Parkinson's disease. She's accompanied by her husband today. I last saw her on 11/16/2015, at which time she was taking Sinemet 4 times a day, as well as Sinemet CR at bedtime. She did call in the interim and we increased her Sinemet to 1-1/2 pills 4 times a day. She was referred to another psychiatrist in the interim. She did have a fall in or around August. She had limited her driving to local roads only. She was trying to walk daily.  Today, 08/14/2016: She reports doing reasonably okay. She no longer sees psychiatry since last year. She no longer takes the Effexor as of fall last year, no longer sees the counselor. She went to see her GGD in [place], now about 37 mo old. Saw Vasc specialist, Dr. Lucky Cowboy in Feb, had doppler US both legs for swelling. No DVT, was told to lose weight and increase her HCTZ and walk more, all of which she has achieved. No recent falls, thankfully. Driving with limitation. Not sleeping well at night, chronic issue, Constipation is under reasonable control, takes daily MiraLAX.   Previously  I saw her on 07/20/2015, at which time she was taking Sinemet about 3 hours apart around 7, 10, 1 PM and 4 PM. She was doing fairly well, mood was stable, she was 7 low-dose long-acting Effexor 37.5 mg daily. No recent falls reported. She was taking her Sinemet CR at 9 PM and no longer any melatonin as she felt she was sleeping better. We mutually agreed to make a referral to geriatric psychiatry.       I saw her on 02/22/2015, at which time she reported doing better overall. She had spent some time with her daughter up in  [place] and this went well. She came back around Thanksgiving 2016. She felt that the Sinemet was not lasting as long and was taking it at 7 AM, 11:30 AM, and 5 PM. She was taking the CR at bedtime, around 11 PM. She was also taking melatonin 6 mg at bedtime. She had been on Effexor XR 37.5 mg once daily in the morning. She did have an episode of double vision in November while she was staying with her daughter and saw an ophthalmologist and then a neuro-ophthalmologist as I understand. She had an MRI brain with and without contrast on 12/07/2014: Findings consistent with moderate chronic small vessel ischemic disease. Moderate atrophy along the vertex with prominent CSF signal along the medial right and left frontal lobes. No acute infarct, hemorrhage or enhancing mass. She had blood work on 02/03/2015: Hemoglobin A1c was 5.1, CBC with differential unremarkable, lipid profile showed total cholesterol of 171, triglycerides 203, LDL 82, HDL 48.5. Urinalysis was negative. TSH normal at 3.2 vitamin B12 elevated at greater than 1500.    I suggested we change her medication regimen slightly with Sinemet 1 pill 4 times a day at 7 AM, 10 AM, 1 PM and 5 PM, the CR at 9 PM as well as melatonin at 9 PM. Her daughter called in the interim in April 2017 reporting additional problems with mood, including rumination of thoughts and frustration. She spoke with  Dr. Jannifer Franklin at the time, who suggested that her Sinemet could be changed to Sinemet CR and the Effexor could be increased.   I first met her on 11/18/2014 and she previously followed with Dr. Jim Like and prior to that with Dr. Morene Antu. At the time of her visit in October 2016 she reported doing fairly well. Her daughter reported more depression and anxiety issues as well as more stress. [name] was taking her up [place] to stay with her for the next several weeks. She was no longer on Paxil as she had side effects on it. She was recently started on Wellbutrin but she  was in the process of switching to Effexor XR. She had no tremors on Paxil. Mood wise, she felt Paxil was helpful however. She reported Parkinson symptoms for about 5+ years prior. She had noted an intermittent right hand tremor in the beginning which gradually progressed. She started having issues with constipation for which she takes MiraLAX as needed. She was not exercising regularly and not always drinking enough water. I suggested she try melatonin for sleep for insomnia. I asked her to continue with Sinemet 25-100 milligrams strength one pill 3 times a day and add Sinemet CR 50-200 milligrams 1 pill at bedtime. In the interim, her daughter called on 11/30/2014 with problems with regards to her Effexor XR. She felt that she was doing worse on it. They requested an alternative prescription but [place] was [place] at the time.   11/18/2014: This is her first visit with me and she previously followed with Dr. Jim Like and prior to that with Dr. Morene Antu. She was seen by Dr. Janann Colonel on 08/26/2012, at which time she was doing well. She was taking Sinemet 3 times a day, at 6:30 AM, 11:30 AM and 5:30 PM. She had right-sided more than left-sided symptoms, tremor predominant. She had intermittent possible dyskinesias of her left foot. She had some constipation.   She was seen recently by [name], nurse practitioner on 09/21/2014, at which time she was kept on her medication regimen.   Dr. Morene Antu (02/12/12): He felt she was doing very well. She was to resume an exercise program and did not have any medication changes at the time. Her falls assessment tool score at the time was 8. She has an underlying medical history of stroke, hypertension, hyperlipidemia. She is currently on Flexeril as needed, Inderal LA 80 mg daily, amlodipine-benazepril, slow FE, calcium vitamin D, baby aspirin, Protonix, Plavix, simvastatin, Sinemet 25/100 mg strength one tablet 3 times a day.   I reviewed Dr. Tressia Danas prior  notes and the patient's records and below is a summary of that review:   83 year old right-handed woman who started noticing a hand tremor some 6 years ago and was first evaluated by Dr. love on 02/01/2009. This tremor started in her right thumb. Once this test is tremor. She has been on Inderal for 5 years in my sleep for 2 years she denies memory loss, hallucinations, delusions, or depression. She has sleep study due to sleepiness reported. This was done in October 2013 and showed mild PLMs and mild sleep apnea. She was unable to tolerate CPAP. In January her MMSE was 29, clock drawing was 4, animal fluency was 20.   The patient's allergies, current medications, family history, past medical history, past social history, past surgical history and problem list were reviewed and updated as appropriate.    Her Past Medical History Is Significant For: Past Medical History:  Diagnosis Date  . Anxiety   . Borderline diabetic    08-2014  . Depression   . Insomnia with sleep apnea, unspecified   . Other and unspecified hyperlipidemia   . Parkinson's disease ([place])   . Skin cancer, basal cell   . Sleep disorder   . Unspecified essential hypertension     Her Past Surgical History Is Significant For: Past Surgical History:  Procedure Laterality Date  . CATARACT EXTRACTION Bilateral     Her Family History Is Significant For: Family History  Problem Relation Age of Onset  . Heart failure Mother   . Hyperlipidemia Mother   . Heart attack Father   . Diabetes Father   . Brain cancer Sister   . Psychiatric Illness Sister   . Stroke Sister   . Deep vein thrombosis Sister   . Diabetes Brother     Her Social History Is Significant For: Social History   Social History  . Marital status: Married    Spouse name: Joe  . Number of children: 2  . Years of education: College   Occupational History  . Retired     Materials engineer   Social History Main Topics  . Smoking status: Never Smoker  .  Smokeless tobacco: Never Used  . Alcohol use No  . Drug use: No  . Sexual activity: Not Asked   Other Topics Concern  . None   Social History Narrative   Patient lives at home with her husband [name]. Patient is a homemaker.   Right handed.   One cup of coffee daily.    Her Allergies Are:  Allergies  Allergen Reactions  . Phenobarbital Other (See Comments)    Overall restlessness  :   Her Current Medications Are:  Outpatient Encounter Prescriptions as of 08/14/2016  Medication Sig  . CALCIUM ASCORBATE PO Take 600 mg by mouth daily.   . Calcium Carbonate-Vitamin D 600-400 MG-UNIT tablet Take by mouth.  . carbidopa-levodopa (SINEMET CR) 50-200 MG tablet Take 1 tablet by mouth at bedtime.  . carbidopa-levodopa (SINEMET IR) 25-100 MG tablet Take 1.5 tablets by mouth 4 (four) times daily.  . clopidogrel (PLAVIX) 75 MG tablet Take 75 mg by mouth daily.  . Cyanocobalamin (B-12) 2500 MCG TABS Take by mouth.  . cyclobenzaprine (FLEXERIL) 10 MG tablet Take 1 tablet (10 mg total) by mouth at bedtime as needed for muscle spasms.  . hydrochlorothiazide (HYDRODIURIL) 12.5 MG tablet Take 25 mg by mouth daily.  . pantoprazole (PROTONIX) 20 MG tablet Take 40 mg by mouth daily.   Marland Kitchen SIMVASTATIN PO Take 20 mg by mouth daily.   . [DISCONTINUED] Ferrous Sulfate (SLOW FE PO) Take 1 tablet by mouth.  . [DISCONTINUED] propranolol (INDERAL) 80 MG tablet Take 80 mg by mouth daily.   . [DISCONTINUED] venlafaxine (EFFEXOR) 37.5 MG tablet Take 37.5 mg by mouth 2 (two) times daily.   No facility-administered encounter medications on file as of 08/14/2016.   :  Review of Systems:  Out of a complete 14 point review of systems, all are reviewed and negative with the exception of these symptoms as listed below: Review of Systems  Neurological:       Pt presents today to follow up on her PD. Pt says that the sinemet 1.5 tabs QID may need to be increased because she is noticing more of a tremor. Pt denies any  recent falls.    Objective:  Neurological Exam  Physical Exam Physical Examination:   Vitals:  08/14/16 0829  BP: 138/82  Pulse: 72    General Examination: The patient is a very pleasant 83 y.o. female in no acute distress. She appears well-developed and well-nourished and well groomed. Good spirits.  HEENT: Normocephalic, atraumatic, pupils are equal, round and reactive to light and accommodation, status post bilateral cataract repairs. Extraocular tracking shows mild to moderate saccadic breakdown. She has no nystagmus. She has a mild decrease in eye blink rate and mild facial masking. Hearing is intact. Speech is mildly hypophonic. She has an intermittent lower lip and jaw tremor, unchanged. Neck is mildly rigid. Oropharynx exam shows mild to moderate mouth dryness, otherwise fine. Tongue protrudes centrally and palate elevates symmetrically.   Chest: is clear to auscultation without wheezing, rhonchi or crackles noted.   Heart: sounds are regular and normal without murmurs, rubs or gallops noted.   Abdomen: is soft, non-tender and non-distended with normal bowel sounds appreciated on auscultation.  Extremities: There is 1+ pitting edema in the distal lower extremities bilaterally, slightly worse, particularly on the right. Pedal pulses are intact.   Skin: is warm and dry with chronic stasis type changes and mild discoloration of the distal lower extremities and feet. Age-related changes are noted on the skin, bruising of forearms.   Musculoskeletal: exam reveals no obvious joint deformities, tenderness, joint swelling or erythema.  Neurologically:  Mental status: The patient is awake and alert, paying good  attention. She is able to provide the history. Her husband tries to add info. The patient's memory, attention, language and knowledge seemed well preserved. She may have mild bradyphrenia.    Cranial nerves are as described above under HEENT exam. In addition,  shoulder shrug is normal with slightly unequal shoulder height noted, right higher than left.   Motor exam: Normal bulk, and strength for age is noted. There are mild upper body dyskinesias noted. She has an intermittent mild to at times moderate resting tremor in the right upper extremity. She has no overt tremor otherwise. Stable findings mostly. Fine motor skills are moderately impaired with finger taps, hand movements and rapid alternating patting in the right upper extremity and mild to moderately impaired in the left upper extremity. Foot agility and foot taps are moderately impaired on the right and mildly so on the left. She stands up with mild difficulty but does not have to push herself up. Her posture is mildly stooped. She has a slight tilt to the right, fairly unchanged. She walks with decreased stride length and mildly decreased pace and decreased arm swing on the left and near absent arm swing on the right. She turns in 3 steps. Reflexes are trace in the lower extremities and 1+ in the upper extremities. Sensory exam is intact to light touch. Tandem walk is not possible for her.   Assessment and Plan:   In summary, NAVPREET SZCZYGIEL is a very pleasant 83 year old female with an underlying medical history of TIA, hypertension, hyperlipidemia, and overweight state, history of complex sleep apnea and PLMD, with a remote history of restless leg syndrome, who presents for followup consultation of her Parkinson's disease. She has a 6 to 7 year history of right-sided predominant Parkinson's disease, was on lower dose levodopa for quite some time and managed well for several years. She had an interim increase in her stress level, anxiety and depression, all of which have improved, particularly after she spent some time with her daughter up [place] in [place]. She no longer is on Effexor long-acting 37.5 mg  daily  and no longer sees the counselor. For constipation she takes MiraLAX. She was told to  lose weight and walk more by her vascular surgeon as well and has achieve those goals but does indulge in some salty snacks or watermelon with salt sprinkled on it lately. She is advised to avoid additional salt intake as her blood pressure has trended a little higher as per her report and she does have slightly worse swelling today on exam. She is encouraged to continue to walk regularly and be proactive with her constipation. She has a tiny little bit of dyskinesias today on exam and I would be reluctant to increase her Sinemet. She is advised to continue with her Sinemet IR 1-1/2 pills 4 times a day and CR at bedtime. She's agreeable. Mood and memory-wise she is stable. She is again reminded to be cautious with her driving, local roads only, daytime driving on familiar roads only. She is agreeable. I would like to see her back in 6 months, sooner as needed. I renewed her prescription for Sinemet CR, she did not need refills on the Sinemet IR. I answered all their questions today and the patient and her husband were in agreement.  I spent 25 minutes in total face-to-face time with the patient, more than 50% of which was spent in counseling and coordination of care, reviewing test results, reviewing medication and discussing or reviewing the diagnosis of PD, its prognosis and treatment options. Pertinent laboratory and imaging test results that were available during this visit with the patient were reviewed by me and considered in my medical decision making (see chart for details).

## 2016-11-06 ENCOUNTER — Telehealth: Payer: Self-pay

## 2016-11-06 NOTE — Telephone Encounter (Addendum)
Received a fax from pt stating that [employer] has advised her that the sinemet BN needs additional information needed to determine coverage of the request information.  I called [employer]. I spent 22 minutes on the phone with them. They advised me that another pa for BN sinemet will need to be completed. I will complete this as I am able.

## 2016-11-07 NOTE — Telephone Encounter (Signed)
Completed PA for BN sinemet. Sent to [place]. Should have a determination in 1-3 business days.

## 2016-11-08 NOTE — Telephone Encounter (Signed)
I called pt. I advised her that sinemet was approved by [employer]. I asked her to call me with further questions or concerns. Pt verbalized understanding.

## 2016-11-08 NOTE — Telephone Encounter (Signed)
Received notice from [facility] [employer] that pt does not have pharmacy benefits with them.  I tried another pa with [employer]. PA for sinemet was approved. "CaseId:[id number];Status:Approved;Review Type:Prior Auth;Coverage Start Date:10/09/2016;Coverage End Date:11/08/2017"

## 2017-02-15 ENCOUNTER — Ambulatory Visit (INDEPENDENT_AMBULATORY_CARE_PROVIDER_SITE_OTHER): Payer: Medicare Other | Admitting: Neurology

## 2017-02-15 ENCOUNTER — Encounter: Payer: Self-pay | Admitting: Neurology

## 2017-02-15 DIAGNOSIS — G20 Parkinson's disease: Secondary | ICD-10-CM

## 2017-02-15 DIAGNOSIS — F418 Other specified anxiety disorders: Secondary | ICD-10-CM | POA: Diagnosis not present

## 2017-02-15 DIAGNOSIS — K5909 Other constipation: Secondary | ICD-10-CM

## 2017-02-15 MED ORDER — CARBIDOPA-LEVODOPA ER 50-200 MG PO TBCR: 1.0000 | EXTENDED_RELEASE_TABLET | Freq: Every day | ORAL | 3 refills | Status: DC

## 2017-02-15 MED ORDER — CARBIDOPA-LEVODOPA 25-100 MG PO TABS: 1.5000 | ORAL_TABLET | Freq: Four times a day (QID) | ORAL | 3 refills | Status: DC

## 2017-02-15 NOTE — Patient Instructions (Addendum)
Please continue your walking.  You can certainly look into the boxing class.  Please be really proactive with your constipation medication regimen, titrating as needed to where you have a formed stool at least every other day.  Please monitor weight, you should not lose more weight.  We will keep your medications the same.  Fall risk is real! Please remember to stand up slowly and get your bearings first turn slowly, no bending down to pick anything, no heavy lifting, be extra careful at night and first thing in the morning. Also, be careful in the Bathroom and the kitchen.  We will see you back in 6 months.

## 2017-02-15 NOTE — Progress Notes (Signed)
Subjective:    Patient ID: Tiffany Ramirez is a 84 y.o. female.  HPI     Interim history:   Tiffany Ramirez is a very pleasant 84 year old right-handed woman with an underlying medical history of TIA, hypertension, hyperlipidemia, and overweight state, history of complex sleep apnea and PLMD (with a remote history of RLS), who presents for followup consultation of her Parkinson's disease. She's accompanied by her husband again today. I last saw her on 08/14/2016, at which time she felt fairly stable. She was trying to exercise by way of walking more. She had no recent falls. I suggested we keep her Sinemet at 1-1/2 pills 4 times a day and Sinemet CR at night.  Today, 02/15/2017: She reports feeling stable. Thankfully, no recent falls. She still has difficulty sleeping at night. She has tried over the course of time different approaches to sleep, she has not recently taken melatonin but has certainly try to off and on. Constipation is under reasonable control, takes MiraLAX daily. She takes ranitidine immediate release Sinemet 4 times a day, 1 and a half pills at  7, 11, 3 PM, and 6 PM and generic Sinemet CR 50-200 milligrams strength, 1 pill around 10 PM. she tries to walk daily, typically inside the house.   The patient's allergies, current medications, family history, past medical history, past social history, past surgical history and problem list were reviewed and updated as appropriate.   Previously (copied from previous notes for reference):    I saw her on 11/16/2015, at which time she was taking Sinemet 4 times a day, as well as Sinemet CR at bedtime. She did call in the interim and we increased her Sinemet to 1-1/2 pills 4 times a day. She was referred to another psychiatrist in the interim. She did have a fall in or around August. She had limited her driving to local roads only. She was trying to walk daily.    I saw her on 07/20/2015, at which time she was taking Sinemet about 3 hours  apart around 7, 10, 1 PM and 4 PM. She was doing fairly well, mood was stable, she was 7 low-dose long-acting Effexor 37.5 mg daily. No recent falls reported. She was taking her Sinemet CR at 9 PM and no longer any melatonin as she felt she was sleeping better. We mutually agreed to make a referral to geriatric psychiatry.    I saw her on 02/22/2015, at which time she reported doing better overall. She had spent some time with her daughter up in [place] and this went well. She came back around Thanksgiving 2016. She felt that the Sinemet was not lasting as long and was taking it at 7 AM, 11:30 AM, and 5 PM. She was taking the CR at bedtime, around 11 PM. She was also taking melatonin 6 mg at bedtime. She had been on Effexor XR 37.5 mg once daily in the morning. She did have an episode of double vision in November while she was staying with her daughter and saw an ophthalmologist and then a neuro-ophthalmologist as I understand. She had an MRI brain with and without contrast on 12/07/2014: Findings consistent with moderate chronic small vessel ischemic disease. Moderate atrophy along the vertex with prominent CSF signal along the medial right and left frontal lobes. No acute infarct, hemorrhage or enhancing mass. She had blood work on 02/03/2015: Hemoglobin A1c was 5.1, CBC with differential unremarkable, lipid profile showed total cholesterol of 171, triglycerides 203, LDL 82, HDL 48.5.  Urinalysis was negative. TSH normal at 3.2 vitamin B12 elevated at greater than 1500.    I suggested we change her medication regimen slightly with Sinemet 1 pill 4 times a day at 7 AM, 10 AM, 1 PM and 5 PM, the CR at 9 PM as well as melatonin at 9 PM. Her daughter called in the interim in April 2017 reporting additional problems with mood, including rumination of thoughts and frustration. She spoke with Dr. Jannifer Franklin at the time, who suggested that her Sinemet could be changed to Sinemet CR and the Effexor could be increased.    I first met her on 11/18/2014 and she previously followed with Dr. Jim Like and prior to that with Dr. Morene Antu. At the time of her visit in October 2016 she reported doing fairly well. Her daughter reported more depression and anxiety issues as well as more stress. Tiffany Ramirez was taking her up [place] to stay with her for the next several weeks. She was no longer on Paxil as she had side effects on it. She was recently started on Wellbutrin but she was in the process of switching to Effexor XR. She had no tremors on Paxil. Mood wise, she felt Paxil was helpful however. She reported Parkinson symptoms for about 5+ years prior. She had noted an intermittent right hand tremor in the beginning which gradually progressed. She started having issues with constipation for which she takes MiraLAX as needed. She was not exercising regularly and not always drinking enough water. I suggested she try melatonin for sleep for insomnia. I asked her to continue with Sinemet 25-100 milligrams strength one pill 3 times a day and add Sinemet CR 50-200 milligrams 1 pill at bedtime. In the interim, her daughter called on 11/30/2014 with problems with regards to her Effexor XR. She felt that she was doing worse on it. They requested an alternative prescription but she was [place] of [place] at the time.   11/18/2014: This is her first visit with me and she previously followed with Dr. Jim Like and prior to that with Dr. Morene Antu. She was seen by Dr. Janann Colonel on 08/26/2012, at which time she was doing well. She was taking Sinemet 3 times a day, at 6:30 AM, 11:30 AM and 5:30 PM. She had right-sided more than left-sided symptoms, tremor predominant. She had intermittent possible dyskinesias of her left foot. She had some constipation.   She was seen recently by [name], nurse practitioner on 09/21/2014, at which time she was kept on her medication regimen.   Dr. Morene Antu (02/12/12): He felt she was doing very well. She was to  resume an exercise program and did not have any medication changes at the time. Her falls assessment tool score at the time was 8. She has an underlying medical history of stroke, hypertension, hyperlipidemia. She is currently on Flexeril as needed, Inderal LA 80 mg daily, amlodipine-benazepril, slow FE, calcium vitamin D, baby aspirin, Protonix, Plavix, simvastatin, Sinemet 25/100 mg strength one tablet 3 times a day.   I reviewed Dr. Tressia Danas prior notes and the patient's records and below is a summary of that review:   84 year old right-handed woman who started noticing a hand tremor some 6 years ago and was first evaluated by Dr. love on 02/01/2009. This tremor started in her right thumb. Once this test is tremor. She has been on Inderal for 5 years in my sleep for 2 years she denies memory loss, hallucinations, delusions, or depression. She has sleep study  due to sleepiness reported. This was done in October 2013 and showed mild PLMs and mild sleep apnea. She was unable to tolerate CPAP. In January her MMSE was 29, clock drawing was 4, animal fluency was 20.    Her Past Medical History Is Significant For: Past Medical History:  Diagnosis Date  . Anxiety   . Borderline diabetic    08-2014  . Depression   . Insomnia with sleep apnea, unspecified   . Other and unspecified hyperlipidemia   . Parkinson's disease ([place])   . Skin cancer, basal cell   . Sleep disorder   . Unspecified essential hypertension     Her Past Surgical History Is Significant For: Past Surgical History:  Procedure Laterality Date  . CATARACT EXTRACTION Bilateral     Her Family History Is Significant For: Family History  Problem Relation Age of Onset  . Heart failure Mother   . Hyperlipidemia Mother   . Heart attack Father   . Diabetes Father   . Brain cancer Sister   . Psychiatric Illness Sister   . Stroke Sister   . Deep vein thrombosis Sister   . Diabetes Brother     Her Social History Is Significant  For: Social History   Socioeconomic History  . Marital status: Married    Spouse name: Joe  . Number of children: 2  . Years of education: College  . Highest education level: None  Social Needs  . Financial resource strain: None  . Food insecurity - worry: None  . Food insecurity - inability: None  . Transportation needs - medical: None  . Transportation needs - non-medical: None  Occupational History  . Occupation: Retired    Comment: Materials engineer  Tobacco Use  . Smoking status: Never Smoker  . Smokeless tobacco: Never Used  Substance and Sexual Activity  . Alcohol use: No  . Drug use: No  . Sexual activity: None  Other Topics Concern  . None  Social History Narrative   Patient lives at home with her husband [name]. Patient is a homemaker.   Right handed.   One cup of coffee daily.    Her Allergies Are:  Allergies  Allergen Reactions  . Phenobarbital Other (See Comments)    Overall restlessness  :   Her Current Medications Are:  Outpatient Encounter Medications as of 02/15/2017  Medication Sig  . Calcium Carbonate-Vitamin D 600-400 MG-UNIT tablet Take by mouth.  . carbidopa-levodopa (SINEMET CR) 50-200 MG tablet Take 1 tablet by mouth at bedtime.  . carbidopa-levodopa (SINEMET IR) 25-100 MG tablet Take 1.5 tablets by mouth 4 (four) times daily.  . clopidogrel (PLAVIX) 75 MG tablet Take 75 mg by mouth daily.  . Cyanocobalamin (B-12) 2500 MCG TABS Take by mouth.  . hydrochlorothiazide (HYDRODIURIL) 12.5 MG tablet Take 25 mg by mouth daily.  . pantoprazole (PROTONIX) 20 MG tablet Take 40 mg by mouth daily.   . polyethylene glycol (MIRALAX / GLYCOLAX) packet Take 17 g by mouth daily.  Marland Kitchen SIMVASTATIN PO Take 20 mg by mouth daily.   . [DISCONTINUED] CALCIUM ASCORBATE PO Take 600 mg by mouth daily.   . [DISCONTINUED] cyclobenzaprine (FLEXERIL) 10 MG tablet Take 1 tablet (10 mg total) by mouth at bedtime as needed for muscle spasms.   No facility-administered encounter  medications on file as of 02/15/2017.   :  Review of Systems:  Out of a complete 14 point review of systems, all are reviewed and negative with the exception of these symptoms  as listed below: Review of Systems  Neurological:       Pt presents today to discuss her PD. Pt denies any new concerns and any recent falls.    Objective:  Neurological Exam  Physical Exam Physical Examination:   Vitals:   02/15/17 1110  BP: (!) 165/77  Pulse: 60    General Examination: The patient is a very pleasant 84 y.o. female in no acute distress. She appears well-developed and well-nourished and well groomed.  she has lost weight. She has lost almost 32 pounds in the past year or so.   HEENT:Normocephalic, atraumatic, pupils are equal, round and reactive to light and accommodation, status post bilateral cataract repairs. Extraocular tracking shows mild to moderate saccadic breakdown. She has no nystagmus. She has a mild decrease in eye blink rate and mild facial masking. Hearing is intact. Speech is mildly hypophonic. She has an intermittent lower lip and jaw tremor, unchanged. Neck is mildly rigid. Oropharynx exam shows mild to moderate mouth dryness, otherwise fine. Tongue protrudes centrally and palate elevates symmetrically.   Chest:is clear to auscultation without wheezing, rhonchi or crackles noted.   Heart:sounds are regular and normal without murmurs, rubs or gallops noted.   Abdomen:is soft, non-tender and non-distended with normal bowel sounds appreciated on auscultation.  Extremities:There is trace edema in the distal lower extremities bilaterally.   Skin: is warm and dry with chronic stasis type changes and mild discoloration of the distal lower extremities and feet. Age-related changes are noted on the skin, bruising of forearms.   Musculoskeletal: exam reveals no obvious joint deformities, tenderness, joint swelling or erythema.  Neurologically:  Mental status: The patient  is awake and alert, paying good attention. She is able to provide the history. Her husband supplements Hx. The patient's memory, attention, language and knowledge seemed well preserved. She has mild bradyphrenia.   Cranial nerves are as described above under HEENT exam. In addition, shoulder shrug is normal with slightly unequal shoulder height noted, right higher than left.   Motor exam: Normal bulk, and strength for age is noted. There are mild upper body dyskinesias noted. She has an intermittent mild to at times moderate resting tremor in the right upper extremity. She has no overt tremor otherwise. Stable findings for the most part. Fine motor skills are moderately impaired with finger taps, hand movements and rapid alternating patting in the right upper extremity and mild to moderately impaired in the left upper extremity. Foot agility and foot taps are moderately impaired on the right and mildly so on the left. She stands up with mild difficulty. Her posture is mildly stooped. She has a slight tilt to the right, fairly unchanged. She walks with decreased stride length and mildly decreased pace and decreased arm swing on the R>L. She turns in 3 steps. Reflexes are trace in the lower extremities and 1+ in the upper extremities. Sensory exam is intact to light touch. Tandem walk is not possible for her.   Assessment and Plan:   In summary, SYDELL PROWELL is a very pleasant 84 year old female with an underlying medical history of TIA, hypertension, hyperlipidemia, and overweight state, history of complex sleep apnea and PLMD, with a remote history of restless leg syndrome, who presents for followup consultation of her Parkinson's disease. She has a 7+ year history of right-sided predominant Parkinson's disease, was on lower dose levodopa for quite some time and managed well for several years. She had an interim increase in her stress level, anxiety and  depression, all of which have improved  lately, particularly after she spent some time with her daughter in [place]. She no longer is on Effexor long-acting 37.5 mg daily  and no longer sees the counselor, had seen psychiatry as well. For constipation she takes MiraLAX. She was told to lose weight and walk more by her vascular surgeon as well and has achieved those goals  and her A1c last year was 5.5. She is advised to  to continue to exercise in the form of walking, stay well hydrated with water. She is proactive with regards to her constipation. She has lost quite a bit of weight. She is advised to monitor her weight and try to lose anymore weight. She can continue with Sinemet brand-name immediate release 1-1/2 pills 4 times a day and generic Sinemet CR at bedtime. I renewed her prescriptions today. She is advised to follow-up in 6 months, sooner as needed. I answered all their questions today and the patient and her husband were in agreement.  I spent 20 minutes in total face-to-face time with the patient, more than 50% of which was spent in counseling and coordination of care, reviewing test results, reviewing medication and discussing or reviewing the diagnosis of PD, its prognosis and treatment options. Pertinent laboratory and imaging test results that were available during this visit with the patient were reviewed by me and considered in my medical decision making (see chart for details).

## 2017-02-22 ENCOUNTER — Other Ambulatory Visit: Payer: Self-pay | Admitting: Internal Medicine

## 2017-02-22 DIAGNOSIS — Z1231 Encounter for screening mammogram for malignant neoplasm of breast: Secondary | ICD-10-CM

## 2017-03-08 ENCOUNTER — Ambulatory Visit
Admission: RE | Admit: 2017-03-08 | Discharge: 2017-03-08 | Disposition: A | Discharge status: Home / Self Care | Payer: Medicare Other | Source: Ambulatory Visit | Attending: Internal Medicine | Admitting: Internal Medicine

## 2017-03-08 DIAGNOSIS — Z1231 Encounter for screening mammogram for malignant neoplasm of breast: Secondary | ICD-10-CM

## 2017-04-26 ENCOUNTER — Telehealth: Payer: Self-pay | Admitting: Neurology

## 2017-04-26 DIAGNOSIS — G20 Parkinson's disease: Secondary | ICD-10-CM

## 2017-04-26 MED ORDER — CARBIDOPA-LEVODOPA 25-100 MG PO TABS: 1.5000 | ORAL_TABLET | Freq: Four times a day (QID) | ORAL | 3 refills | Status: DC

## 2017-04-26 NOTE — Telephone Encounter (Signed)
Pt called the pharmacy is unable to get brand name carbidopa-levodopa (SINEMET IR) 25-100 MG tablet. Pt said the generic did not work as well, she also notes the brand is not working as well now. Pharmacy: [facility]. Please call to advise

## 2017-04-26 NOTE — Telephone Encounter (Signed)
I called pt to discuss. No answer, left a message asking her to call me back. 

## 2017-04-26 NOTE — Telephone Encounter (Signed)
Pt returned my call. She reports that she wants an RX for generic sinemet 25-100mg  sent to [facility] because neither [facility] nor [facility] carries brand name sinemet.  I spoke with Dr. Rexene Alberts, she is agreeable to the generic of sinemet. RX sent to [facility].

## 2017-08-16 ENCOUNTER — Encounter: Payer: Self-pay | Admitting: Neurology

## 2017-08-16 ENCOUNTER — Ambulatory Visit (INDEPENDENT_AMBULATORY_CARE_PROVIDER_SITE_OTHER): Payer: Medicare Other | Admitting: Neurology

## 2017-08-16 VITALS — BP 155/70 | HR 66 | Ht 66.0 in | Wt 145.0 lb

## 2017-08-16 DIAGNOSIS — G20 Parkinson's disease: Secondary | ICD-10-CM | POA: Diagnosis not present

## 2017-08-16 NOTE — Progress Notes (Signed)
Subjective:    Patient ID: Tiffany Ramirez is a 84 y.o. female.  HPI     Interim history:   Tiffany Ramirez is an 84 year old right-handed woman with an underlying medical history of TIA, hypertension, hyperlipidemia, and overweight state, history of complex sleep apnea and PLMD (with a remote history of RLS), who presents for followup consultation of her Parkinson's disease. She's accompanied by her husband again today. I last saw her on 02/15/2017, at which time she reported feeling fairly stable, thankfully had no recent falls. She had difficulty sleeping at night. She was taking brand-name Sinemet 1-1/2 pill 4 times a day as well as Sinemet CR at bedtime.  Her insurance stopped covering the brand-name Sinemet.  Today, 08/16/2017: She reports having had some off times and sudden off once recently. Takes her 1 pill one separated from the  of C/L. Takes the first full pill at 7 AM, 11 AM, 3 PM and 7 PM but in between she takes a half pill for total of 6 pills daily and she takes the CR at night. She has not had any falls thankfully has had some tripping episodes or near falls. She tries to walk inside the house at least 30 minutes per day. They are getting ready to sell their house and downsize and move into a retirement community, they are on the wait list for a retirement community in [place]. Daughter from [place] is visiting tonight. Mood is fairly stable, mild memory loss but nothing serious. She has been trying to drink enough water. Constipation is a chronic problem for her and she tries to be proactive.  The patient's allergies, current medications, family history, past medical history, past social history, past surgical history and problem list were reviewed and updated as appropriate.    Previously (copied from previous notes for reference):    I saw her on 08/14/2016, at which time she felt fairly stable. She was trying to exercise by way of walking more. She had no recent falls.  I suggested we keep her Sinemet at 1-1/2 pills 4 times a day and Sinemet CR at night.    I saw her on 11/16/2015, at which time she was taking Sinemet 4 times a day, as well as Sinemet CR at bedtime. She did call in the interim and we increased her Sinemet to 1-1/2 pills 4 times a day. She was referred to another psychiatrist in the interim. She did have a fall in or around August. She had limited her driving to local roads only. She was trying to walk daily.   I saw her on 07/20/2015, at which time she was taking Sinemet about 3 hours apart around 7, 10, 1 PM and 4 PM. She was doing fairly well, mood was stable, she was 7 low-dose long-acting Effexor 37.5 mg daily. No recent falls reported. She was taking her Sinemet CR at 9 PM and no longer any melatonin as she felt she was sleeping better. We mutually agreed to make a referral to geriatric psychiatry.    I saw her on 02/22/2015, at which time she reported doing better overall. She had spent some time with her daughter up in [place] and this went well. She came back around Thanksgiving 2016. She felt that the Sinemet was not lasting as long and was taking it at 7 AM, 11:30 AM, and 5 PM. She was taking the CR at bedtime, around 11 PM. She was also taking melatonin 6 mg at bedtime. She had been on  Effexor XR 37.5 mg once daily in the morning. She did have an episode of double vision in November while she was staying with her daughter and saw an ophthalmologist and then a neuro-ophthalmologist as I understand. She had an MRI brain with and without contrast on 12/07/2014: Findings consistent with moderate chronic small vessel ischemic disease. Moderate atrophy along the vertex with prominent CSF signal along the medial right and left frontal lobes. No acute infarct, hemorrhage or enhancing mass. She had blood work on 02/03/2015: Hemoglobin A1c was 5.1, CBC with differential unremarkable, lipid profile showed total cholesterol of 171, triglycerides 203, LDL 82,  HDL 48.5. Urinalysis was negative. TSH normal at 3.2 vitamin B12 elevated at greater than 1500.    I suggested we change her medication regimen slightly with Sinemet 1 pill 4 times a day at 7 AM, 10 AM, 1 PM and 5 PM, the CR at 9 PM as well as melatonin at 9 PM. Her daughter called in the interim in April 2017 reporting additional problems with mood, including rumination of thoughts and frustration. She spoke with Dr. Jannifer Franklin at the time, who suggested that her Sinemet could be changed to Sinemet CR and the Effexor could be increased.   I first met her on 11/18/2014 and she previously followed with Dr. Jim Like and prior to that with Dr. Morene Antu. At the time of her visit in October 2016 she reported doing fairly well. Her daughter reported more depression and anxiety issues as well as more stress. Tiffany Ramirez was taking her up [place] to stay with her for the next several weeks. She was no longer on Paxil as she had side effects on it. She was recently started on Wellbutrin but she was in the process of switching to Effexor XR. She had no tremors on Paxil. Mood wise, she felt Paxil was helpful however. She reported Parkinson symptoms for about 5+ years prior. She had noted an intermittent right hand tremor in the beginning which gradually progressed. She started having issues with constipation for which she takes MiraLAX as needed. She was not exercising regularly and not always drinking enough water. I suggested she try melatonin for sleep for insomnia. I asked her to continue with Sinemet 25-100 milligrams strength one pill 3 times a day and add Sinemet CR 50-200 milligrams 1 pill at bedtime. In the interim, her daughter called on 11/30/2014 with problems with regards to her Effexor XR. She felt that she was doing worse on it. They requested an alternative prescription but she was [place] of [place] at the time.   11/18/2014: This is her first visit with me and she previously followed with Dr. Jim Like and  prior to that with Dr. Morene Antu. She was seen by Dr. Janann Colonel on 08/26/2012, at which time she was doing well. She was taking Sinemet 3 times a day, at 6:30 AM, 11:30 AM and 5:30 PM. She had right-sided more than left-sided symptoms, tremor predominant. She had intermittent possible dyskinesias of her left foot. She had some constipation.   She was seen recently by [name], nurse practitioner on 09/21/2014, at which time she was kept on her medication regimen.   Dr. Morene Antu (02/12/12): He felt she was doing very well. She was to resume an exercise program and did not have any medication changes at the time. Her falls assessment tool score at the time was 8. She has an underlying medical history of stroke, hypertension, hyperlipidemia. She is currently on Flexeril as needed,  Inderal LA 80 mg daily, amlodipine-benazepril, slow FE, calcium vitamin D, baby aspirin, Protonix, Plavix, simvastatin, Sinemet 25/100 mg strength one tablet 3 times a day.   I reviewed Dr. Tressia Danas prior notes and the patient's records and below is a summary of that review:   84 year old right-handed woman who started noticing a hand tremor some 6 years ago and was first evaluated by Dr. love on 02/01/2009. This tremor started in her right thumb. Once this test is tremor. She has been on Inderal for 5 years in my sleep for 2 years she denies memory loss, hallucinations, delusions, or depression. She has sleep study due to sleepiness reported. This was done in October 2013 and showed mild PLMs and mild sleep apnea. She was unable to tolerate CPAP. In January her MMSE was 29, clock drawing was 4, animal fluency was 20.   Her Past Medical History Is Significant For: Past Medical History:  Diagnosis Date  . Anxiety   . Borderline diabetic    08-2014  . Depression   . Insomnia with sleep apnea, unspecified   . Other and unspecified hyperlipidemia   . Parkinson's disease ([place])   . Skin cancer, basal cell   . Sleep disorder    . Unspecified essential hypertension     Her Past Surgical History Is Significant For: Past Surgical History:  Procedure Laterality Date  . CATARACT EXTRACTION Bilateral     Her Family History Is Significant For: Family History  Problem Relation Age of Onset  . Heart failure Mother   . Hyperlipidemia Mother   . Heart attack Father   . Diabetes Father   . Brain cancer Sister   . Psychiatric Illness Sister   . Stroke Sister   . Deep vein thrombosis Sister   . Diabetes Brother     Her Social History Is Significant For: Social History   Socioeconomic History  . Marital status: Married    Spouse name: Joe  . Number of children: 2  . Years of education: College  . Highest education level: Not on file  Occupational History  . Occupation: Retired    Comment: Materials engineer  Social Needs  . Financial resource strain: Not on file  . Food insecurity:    Worry: Not on file    Inability: Not on file  . Transportation needs:    Medical: Not on file    Non-medical: Not on file  Tobacco Use  . Smoking status: Never Smoker  . Smokeless tobacco: Never Used  Substance and Sexual Activity  . Alcohol use: No  . Drug use: No  . Sexual activity: Not on file  Lifestyle  . Physical activity:    Days per week: Not on file    Minutes per session: Not on file  . Stress: Not on file  Relationships  . Social connections:    Talks on phone: Not on file    Gets together: Not on file    Attends religious service: Not on file    Active member of club or organization: Not on file    Attends meetings of clubs or organizations: Not on file    Relationship status: Not on file  Other Topics Concern  . Not on file  Social History Narrative   Patient lives at home with her husband [name]. Patient is a homemaker.   Right handed.   One cup of coffee daily.    Her Allergies Are:  Allergies  Allergen Reactions  . Phenobarbital Other (See Comments)  Overall restlessness  :   Her Current  Medications Are:  Outpatient Encounter Medications as of 08/16/2017  Medication Sig  . Calcium Carbonate-Vitamin D 600-400 MG-UNIT tablet Take by mouth.  . carbidopa-levodopa (SINEMET CR) 50-200 MG tablet Take 1 tablet by mouth at bedtime.  . carbidopa-levodopa (SINEMET IR) 25-100 MG tablet Take 1.5 tablets by mouth 4 (four) times daily.  . clopidogrel (PLAVIX) 75 MG tablet Take 75 mg by mouth daily.  . Cyanocobalamin (B-12) 2500 MCG TABS Take by mouth.  . hydrochlorothiazide (HYDRODIURIL) 12.5 MG tablet Take 25 mg by mouth daily.  . pantoprazole (PROTONIX) 20 MG tablet Take 40 mg by mouth daily.   . polyethylene glycol (MIRALAX / GLYCOLAX) packet Take 17 g by mouth daily.  Marland Kitchen SIMVASTATIN PO Take 20 mg by mouth daily.    No facility-administered encounter medications on file as of 08/16/2017.   :  Review of Systems:  Out of a complete 14 point review of systems, all are reviewed and negative with the exception of these symptoms as listed below: Review of Systems  Neurological:       Pt presents today to discuss her PD. Pt denies any new complaints.    Objective:  Neurological Exam  Physical Exam Physical Examination:   Vitals:   08/16/17 1031  BP: (!) 155/70  Pulse: 66    General Examination: The patient is a very pleasant 84 y.o. female in no acute distress. She appears well-developed and well-nourished and well groomed. Good spirits.  HEENT:Normocephalic, atraumatic, pupils are equal, round and reactive to light and accommodation, status post bilateral cataract repairs. Extraocular tracking shows mild to moderate saccadic breakdown. She has no nystagmus. She has a mild decrease in eye blink rate and mild to moderate facial masking. Hearing is intact. Speech is mildly hypophonic. She has an intermittent lower lip and jaw tremor, unchanged. Neck is mildly rigid. Oropharynx exam shows mild mouth dryness, otherwise fine. Tongue protrudes centrally and palate elevates symmetrically.    Chest:is clear to auscultation without wheezing, rhonchi or crackles noted.   Heart:sounds are regular and normal without murmurs, rubs or gallops noted.   Abdomen:is soft, non-tender and non-distended.  Extremities:There is trace edema in the distal lower extremities bilaterally.   Skin: is warm and dry with chronic stasis type changes and mild discoloration of the distal lower extremities and feet. Age-related changes are noted on the skin, chronic appearing mild bruising of forearms.   Musculoskeletal: exam reveals no obvious joint deformities, tenderness, joint swelling or erythema.  Neurologically:  Mental status: The patient is awake and alert, paying good attention. She is able to provide the history. Her husband supplements Hx. The patient's memory, attention, language and knowledge are quite well preserved. She has mild bradyphrenia.   Cranial nerves are as described above under HEENT exam. In addition, shoulder shrug is normal with slightly unequal shoulder height noted, right higher than left.   Motor exam: Normal bulk, and strength for age is noted. There aremild upper bodydyskinesias noted. She has a mild to at times moderate resting tremor in the right upper extremity. She has no overt tremor otherwise. Stable findings for the most part. Fine motor skills are moderately impaired with finger taps, hand movements and rapid alternating patting in the right upper extremity and mild to moderately impaired in the left upper extremity. Foot agility and foot taps are moderately impaired on the right and mildly so on the left. She stands up with mild difficulty. Her posture is mildly  stooped. She has a slight tilt to the right, fairly unchanged. She walks with decreased stride length and mildly decreased pace and decreased arm swing on the R>L. She turns in 3 steps. Slight steppage R foot. Reflexes are trace in the lower extremities and 1+ in the upper extremities. Sensory  exam is intact to light touch. Tandem walk is not possible for her.   Assessment and Plan:   In summary, Tiffany Ramirez is a very pleasant 84 year old female with an underlying medical history of TIA, hypertension, hyperlipidemia, and overweight state, history of complex sleep apnea and PLMD, with a remote history of restless leg syndrome, who presents for followup consultation of her Parkinson's disease. She has a 7+year history of right-sided predominant Parkinson's disease, was on lower dose levodopa for quite some time and managed well for several years. She had an interim increase in her stress level, anxiety and depression, all of which have improved lately, particularly after she spent some time with her daughter in [place]. [place] longer is onEffexor long-acting 37.5 mg daily and no longer sees the counselor, had seen psychiatry in the past as well. For constipation she takes MiraLAX. She was told to lose weight and walk more by her vascular surgeon and has achieved those goals. A1c improved. She is advised to  to continue to exercise in the form of walking, stay well hydrated with water. She is proactive with regards to her constipation. She has lost weight. She is advised to change her Sinemet regimen a little bit to allow for a more easier schedule. She is advised to take one pill every 3 hours starting at 7 AM for a total of 6 pills, total dose will stay the same. She will continue with the Sinemet CR at bedtime, both are generic at this point. Cognitively she has been stable. I suggested a 6 month follow-up, sooner if needed. They are in the process of selling their house and moving into a retirement community. I answered all their questions today and the patient and her husband were in agreement.  I spent 25 minutes in total face-to-face time with the patient, more than 50% of which was spent in counseling and coordination of care, reviewing test results, reviewing medication and  discussing or reviewing the diagnosis of PD, its prognosis and treatment options. Pertinent laboratory and imaging test results that were available during this visit with the patient were reviewed by me and considered in my medical decision making (see chart for details).

## 2017-08-16 NOTE — Patient Instructions (Signed)
We will try a different schedule with the Sinemet: 1 pill every 3 hours, starting at 7 AM:  Take one pill at 7, 10 AM, 1 PM, 4 PM, 7 PM and 10 PM.  Take your Sinemet CR at bedtime, between 10 PM and 11 PM.

## 2017-08-22 DIAGNOSIS — E1151 Type 2 diabetes mellitus with diabetic peripheral angiopathy without gangrene: Secondary | ICD-10-CM | POA: Insufficient documentation

## 2018-02-18 ENCOUNTER — Encounter: Payer: Self-pay | Admitting: Neurology

## 2018-02-18 ENCOUNTER — Ambulatory Visit (INDEPENDENT_AMBULATORY_CARE_PROVIDER_SITE_OTHER): Payer: Medicare Other | Admitting: Neurology

## 2018-02-18 VITALS — BP 162/78 | HR 71 | Ht 66.0 in | Wt 142.0 lb

## 2018-02-18 DIAGNOSIS — G20 Parkinson's disease: Secondary | ICD-10-CM

## 2018-02-18 DIAGNOSIS — K5909 Other constipation: Secondary | ICD-10-CM

## 2018-02-18 DIAGNOSIS — F418 Other specified anxiety disorders: Secondary | ICD-10-CM | POA: Diagnosis not present

## 2018-02-18 MED ORDER — CARBIDOPA-LEVODOPA ER 50-200 MG PO TBCR: 1.0000 | EXTENDED_RELEASE_TABLET | Freq: Every day | ORAL | 3 refills | Status: DC

## 2018-02-18 MED ORDER — CARBIDOPA-LEVODOPA 25-100 MG PO TABS: 1.0000 | ORAL_TABLET | Freq: Every day | ORAL | 3 refills | Status: AC

## 2018-02-18 NOTE — Patient Instructions (Addendum)
Try to take your sinemet 1 pill 6 times a day, at 7, 10, 1 PM, 4 PM and 7 PM, that way you may be able to have dinner about 6 PM.  Please try to add more protein to your diet, please think about resistance training with exercise bands.  Please be proactive about constipation issues.  Your exam is stable.  Please follow up 6 months.

## 2018-02-18 NOTE — Progress Notes (Signed)
Subjective:    Patient ID: Tiffany Ramirez is a 85 y.o. female.  HPI     Interim history:     Tiffany Ramirez is an 86 year old right-handed woman with an underlying medical history of TIA, hypertension, hyperlipidemia, and overweight state, history of complex sleep apnea and PLMD (with a remote history of RLS), who presents for followup consultation of her Parkinson's disease. She's accompanied by her husband again today. I last saw her on 08/16/2017, at which time I suggested she take her Sinemet 6 times a day, every 3 hours, starting at 7 and take a Sinemet CR at bedtime around 10 or 11 PM. They were in the process of selling their home and moving into a retirement community in [place]. She was trying to stay proactive about constipation issues.  Today, 02/18/2018: She reports taking C/L 5 doses per day, not 6, may have picked up an old bottle. She feels like her tremors getting worse. She does not exercise on a regular basis, sometimes walks outside of with permits. She does not go to the gym. They have moved into a one-story home in or around October 2019, have settled in fairly well. No recent falls reported.  The patient's allergies, current medications, family history, past medical history, past social history, past surgical history and problem list were reviewed and updated as appropriate.    Previously (copied from previous notes for reference):   I saw her on 02/15/2017, at which time she reported feeling fairly stable, thankfully had no recent falls. She had difficulty sleeping at night. She was taking brand-name Sinemet 1-1/2 pill 4 times a day as well as Sinemet CR at bedtime.    Her insurance stopped covering the brand-name Sinemet.    I saw her on 08/14/2016, at which time she felt fairly stable. She was trying to exercise by way of walking more. She had no recent falls. I suggested we keep her Sinemet at 1-1/2 pills 4 times a day and Sinemet CR at night.   I saw her on  11/16/2015, at which time she was taking Sinemet 4 times a day, as well as Sinemet CR at bedtime. She did call in the interim and we increased her Sinemet to 1-1/2 pills 4 times a day. She was referred to another psychiatrist in the interim. She did have a fall in or around August. She had limited her driving to local roads only. She was trying to walk daily.   I saw her on 07/20/2015, at which time she was taking Sinemet about 3 hours apart around 7, 10, 1 PM and 4 PM. She was doing fairly well, mood was stable, she was 7 low-dose long-acting Effexor 37.5 mg daily. No recent falls reported. She was taking her Sinemet CR at 9 PM and no longer any melatonin as she felt she was sleeping better. We mutually agreed to make a referral to geriatric psychiatry.    I saw her on 02/22/2015, at which time she reported doing better overall. She had spent some time with her daughter up in [place] and this went well. She came back around Thanksgiving 2016. She felt that the Sinemet was not lasting as long and was taking it at 7 AM, 11:30 AM, and 5 PM. She was taking the CR at bedtime, around 11 PM. She was also taking melatonin 6 mg at bedtime. She had been on Effexor XR 37.5 mg once daily in the morning. She did have an episode of double vision in November  while she was staying with her daughter and saw an ophthalmologist and then a neuro-ophthalmologist as I understand. She had an MRI brain with and without contrast on 12/07/2014: Findings consistent with moderate chronic small vessel ischemic disease. Moderate atrophy along the vertex with prominent CSF signal along the medial right and left frontal lobes. No acute infarct, hemorrhage or enhancing mass. She had blood work on 02/03/2015: Hemoglobin A1c was 5.1, CBC with differential unremarkable, lipid profile showed total cholesterol of 171, triglycerides 203, LDL 82, HDL 48.5. Urinalysis was negative. TSH normal at 3.2 vitamin B12 elevated at greater than 1500.    I  suggested we change her medication regimen slightly with Sinemet 1 pill 4 times a day at 7 AM, 10 AM, 1 PM and 5 PM, the CR at 9 PM as well as melatonin at 9 PM. Her daughter called in the interim in April 2017 reporting additional problems with mood, including rumination of thoughts and frustration. She spoke with Dr. Jannifer Franklin at the time, who suggested that her Sinemet could be changed to Sinemet CR and the Effexor could be increased.   I first met her on 11/18/2014 and she previously followed with Dr. Jim Like and prior to that with Dr. Morene Antu. At the time of her visit in October 2016 she reported doing fairly well. Her daughter reported more depression and anxiety issues as well as more stress. Tiffany Ramirez was taking her up [place] to stay with her for the next several weeks. She was no longer on Paxil as she had side effects on it. She was recently started on Wellbutrin but she was in the process of switching to Effexor XR. She had no tremors on Paxil. Mood wise, she felt Paxil was helpful however. She reported Parkinson symptoms for about 5+ years prior. She had noted an intermittent right hand tremor in the beginning which gradually progressed. She started having issues with constipation for which she takes MiraLAX as needed. She was not exercising regularly and not always drinking enough water. I suggested she try melatonin for sleep for insomnia. I asked her to continue with Sinemet 25-100 milligrams strength one pill 3 times a day and add Sinemet CR 50-200 milligrams 1 pill at bedtime. In the interim, her daughter called on 11/30/2014 with problems with regards to her Effexor XR. She felt that she was doing worse on it. They requested an alternative prescription but she was [place] of [place] at the time.   11/18/2014: This is her first visit with me and she previously followed with Dr. Jim Like and prior to that with Dr. Morene Antu. She was seen by Dr. Janann Colonel on 08/26/2012, at which time she was doing  well. She was taking Sinemet 3 times a day, at 6:30 AM, 11:30 AM and 5:30 PM. She had right-sided more than left-sided symptoms, tremor predominant. She had intermittent possible dyskinesias of her left foot. She had some constipation.   She was seen recently by Tiffany Ramirez, nurse practitioner on 09/21/2014, at which time she was kept on her medication regimen.   Dr. Morene Antu (02/12/12): He felt she was doing very well. She was to resume an exercise program and did not have any medication changes at the time. Her falls assessment tool score at the time was 8. She has an underlying medical history of stroke, hypertension, hyperlipidemia. She is currently on Flexeril as needed, Inderal LA 80 mg daily, amlodipine-benazepril, slow FE, calcium vitamin D, baby aspirin, Protonix, Plavix, simvastatin, Sinemet 25/100 mg  strength one tablet 3 times a day.   I reviewed Dr. Tressia Danas prior notes and the patient's records and below is a summary of that review:   85 year old right-handed woman who started noticing a hand tremor some 6 years ago and was first evaluated by Dr. love on 02/01/2009. This tremor started in her right thumb. Once this test is tremor. She has been on Inderal for 5 years in my sleep for 2 years she denies memory loss, hallucinations, delusions, or depression. She has sleep study due to sleepiness reported. This was done in October 2013 and showed mild PLMs and mild sleep apnea. She was unable to tolerate CPAP. In January her MMSE was 29, clock drawing was 4, animal fluency was 20.  Her Past Medical History Is Significant For: Past Medical History:  Diagnosis Date  . Anxiety   . Borderline diabetic    08-2014  . Depression   . Insomnia with sleep apnea, unspecified   . Other and unspecified hyperlipidemia   . Parkinson's disease ([place])   . Skin cancer, basal cell   . Sleep disorder   . Unspecified essential hypertension     Her Past Surgical History Is Significant For: Past Surgical  History:  Procedure Laterality Date  . CATARACT EXTRACTION Bilateral     Her Family History Is Significant For: Family History  Problem Relation Age of Onset  . Heart failure Mother   . Hyperlipidemia Mother   . Heart attack Father   . Diabetes Father   . Brain cancer Sister   . Psychiatric Illness Sister   . Stroke Sister   . Deep vein thrombosis Sister   . Diabetes Brother     Her Social History Is Significant For: Social History   Socioeconomic History  . Marital status: Married    Spouse name: Tiffany Ramirez  . Number of children: 2  . Years of education: College  . Highest education level: Not on file  Occupational History  . Occupation: Retired    Comment: Materials engineer  Social Needs  . Financial resource strain: Not on file  . Food insecurity:    Worry: Not on file    Inability: Not on file  . Transportation needs:    Medical: Not on file    Non-medical: Not on file  Tobacco Use  . Smoking status: Never Smoker  . Smokeless tobacco: Never Used  Substance and Sexual Activity  . Alcohol use: No  . Drug use: No  . Sexual activity: Not on file  Lifestyle  . Physical activity:    Days per week: Not on file    Minutes per session: Not on file  . Stress: Not on file  Relationships  . Social connections:    Talks on phone: Not on file    Gets together: Not on file    Attends religious service: Not on file    Active member of club or organization: Not on file    Attends meetings of clubs or organizations: Not on file    Relationship status: Not on file  Other Topics Concern  . Not on file  Social History Narrative   Patient lives at home with her husband Tiffany Ramirez. Patient is a homemaker.   Right handed.   One cup of coffee daily.    Her Allergies Are:  Allergies  Allergen Reactions  . Phenobarbital Other (See Comments)    Overall restlessness  :   Her Current Medications Are:  Outpatient Encounter Medications as of 02/18/2018  Medication Sig  . carbidopa-levodopa  (SINEMET CR) 50-200 MG tablet Take 1 tablet by mouth at bedtime.  . carbidopa-levodopa (SINEMET IR) 25-100 MG tablet Take 1 tablet by mouth 6 (six) times daily. At 7, 10, 1 PM, 4 PM, 7 PM, and 10 PM.  . clopidogrel (PLAVIX) 75 MG tablet Take 75 mg by mouth daily.  . Cyanocobalamin (B-12) 2500 MCG TABS Take by mouth.  . furosemide (LASIX) 20 MG tablet Take 20 mg by mouth daily.  . magnesium hydroxide (MILK OF MAGNESIA) 400 MG/5ML suspension Take by mouth.  . pantoprazole (PROTONIX) 20 MG tablet Take 40 mg by mouth daily.   . polyethylene glycol (MIRALAX / GLYCOLAX) packet Take 17 g by mouth daily.  . Probiotic Product (PROBIOTIC PO) Take by mouth.  Marland Kitchen SIMVASTATIN PO Take 20 mg by mouth daily.   . [DISCONTINUED] carbidopa-levodopa (SINEMET CR) 50-200 MG tablet Take 1 tablet by mouth at bedtime.  . [DISCONTINUED] carbidopa-levodopa (SINEMET IR) 25-100 MG tablet Take 1.5 tablets by mouth 4 (four) times daily.  . [DISCONTINUED] Calcium Carbonate-Vitamin D 600-400 MG-UNIT tablet Take by mouth.  . [DISCONTINUED] hydrochlorothiazide (HYDRODIURIL) 12.5 MG tablet Take 25 mg by mouth daily.   No facility-administered encounter medications on file as of 02/18/2018.   :  Review of Systems:  Out of a complete 14 point review of systems, all are reviewed and negative with the exception of these symptoms as listed below: Review of Systems  Neurological:       Pt presents today to discuss her PD. Pt has been taking 5 tablets of sinemet daily, not the 6 tablets as prescribed. Pt feels that her tremor is worse, she is weak, and she is having trouble sleeping.    Objective:  Neurological Exam  Physical Exam Physical Examination:   Vitals:   02/18/18 1140  BP: (!) 162/78  Pulse: 71    General Examination: The patient is a very pleasant 85 y.o. female in no acute distress. She appears well-developed and well-nourished and well groomed.   HEENT:Normocephalic, atraumatic, pupils are equal, round and  reactive to light and accommodation, status post bilateral cataract repairs. Extraocular tracking shows moderate saccadic breakdown. She has no nystagmus. She has a mild decrease in eye blink rate and mild to moderate facial masking. Hearing is intact to mildly impaired. Speech is mildly hypophonic. She has an intermittent lower lip and jaw tremor, unchanged. Neck is mildly rigid. Oropharynx exam shows mild mouth dryness, otherwise fine. Tongue protrudes centrally and palate elevates symmetrically.   Chest:is clear to auscultation without wheezing, rhonchi or crackles noted.   Heart:sounds are regular and normal without murmurs, rubs or gallops noted.   Abdomen:is soft, non-tender and non-distended.  Extremities:There istraceedema in the distal lower extremities bilaterally.  Skin: is warm and dry with chronic stasis type changes and mild discoloration of the distal lower extremities and feet. Age-related changes are noted on the skin, chronic appearing mild bruising of forearms and hands.   Musculoskeletal: exam reveals no obvious joint deformities, tenderness, joint swelling or erythema.  Neurologically:  Mental status: The patient is awake and alert, paying good attention. She is able to provide the history. Her husband supplements Hx.The patient's memory, attention, language and knowledge are quite well preserved. Shehasmild bradyphrenia.   Cranial nerves are as described above under HEENT exam. In addition, shoulder shrug is normal with slightly unequal shoulder height noted, right higher than left.   Motor exam: Normal bulk, and strength for age is noted. There aremild  upper bodydyskinesias noted. She has a mild to at times moderate resting tremor in the right upper extremity. She has no overt tremor otherwise. Stable findingsfor themost part.Fine motor skills are moderately impaired with finger taps, hand movements and rapid alternating patting in the right upper  extremity and mild to moderately impaired in the left upper extremity. Foot agility and foot taps are moderately impaired on the right and mildly so on the left. She stands up with mild difficulty. Her posture is mildly stooped. She has a slight tilt to the right, fairly unchanged. She walks with decreased stride length and mildly decreased pace and decreased arm swing on theR>L.She turns in 3 steps. Slight steppage R foot. Reflexes are trace in the lower extremities and 1+ in the upper extremities. Sensory exam is intact to light touch. Tandem walk is not possible for her.   Assessment and Plan:   In summary, Tiffany Ramirez is a very pleasant 84 year old female with an underlying medical history of TIA, hypertension, hyperlipidemia, and overweight state, history of complex sleep apnea and PLMD, with a remote history of restless leg syndrome, who presents for followup consultation of her Parkinson's disease, with a 7+year history of right-sided predominant Parkinson's disease, was on lower dose levodopa for quite some time and managed well for several years. She had an interim increase in her stress level, anxiety and depression, all of which have improvedwith time, particularly after she spent some time with her daughter in [place]. Tiffany Ramirez longer is onEffexor long-acting 37.5 mg daily and no longer sees the counselor,had seen psychiatry in the past as well. For constipation she takes MiraLAX prn. She was told to lose weight and walk more by her vascular surgeon and has achievedthose goals. A1c improved as well. She is advised toto continue to exercise in the form of walking and also add some resistance training with the help of resistance bands for example, stay well hydrated with water. She is proactive with regards to her constipation. She has lost weight. She is advised to change her Sinemet regimen a little bit to allow for a moreconsistent schedule. She is advised to take one pill every 3  hours starting at 7 AM for a total of 6 pills, total dose will be 6 pills and she will continue with the Sinemet CR at bedtime, both are generic at this point. Cognitively, she has been stable. I suggested a 6 month follow-up, sooner if needed. I answered all their questions today and the patient and her husband were in agreement.  I spent 20 minutes in total face-to-face time with the patient, more than 50% of which was spent in counseling and coordination of care, reviewing test results, reviewing medication and discussing or reviewing the diagnosis of PD, its prognosis and treatment options. Pertinent laboratory and imaging test results that were available during this visit with the patient were reviewed by me and considered in my medical decision making (see chart for details).

## 2018-03-02 NOTE — Progress Notes (Signed)
03/05/2018  3:15 PM   Tiffany Ramirez 1928-03-28 [id number]  Referring provider: Rusty Aus, MD [facility] [place], [place] [address]  Chief Complaint  Patient presents with  . Urinary Frequency    HPI: Tiffany Ramirez is a 85 y.o. female who presents today to establish urologic care and complaining of urinary frequency and urgency x 5 years.  Patient reports nocturia 3-4 x.  She feels unrested due to having to get up frequently. Patient says symptoms have been experienced for a very long time.  Patient denies any daytime symptoms.  Patient denies any accidents.  Patient denies pedal edema.  Patient says she voids a lot, and she says she feels she is emptying but still has bladder pressure.  Denies gross hematuria, dysuria, and suprapubic pain.  Patient has not tried any medications.    Patient goes to bed between 10:30-11, and wakes at 7AM.  Patient only drinks enough to take medicine before bed.  Patient eats dinner at about 7, and drinks only a cup of decaffeinated tea.  Denies significant daytime symptoms.  During the day, she can hold it for many hours without urgency or frequency.    Risk factors for urinary symptoms includes Parkinson's, sleep apnea, and diabetes (well controlled).  A sleep study in 10/2011 showed mild sleep apnea, however the patient reports that she was told that she did not in fact have sleep apnea.  Patient says she was diagnosed about 9 years ago for Parkinson's, and it has been progressing.  Patient reports that she has been [facility] for frequent UTIs at [facility].  In 12/2017 she was treated for symptoms of presumed UTI, however, UA and culture were negative.  Review of multiple previous UA/urine cultures do not in fact reveal recurrent UTIs.  Patient's PVR today is 14 mL.  LEU 1+ but otherwise negative UA.  PMH: Past Medical History:  Diagnosis Date  . Anxiety   . Borderline diabetic    08-2014  .  Depression   . Insomnia with sleep apnea, unspecified   . Other and unspecified hyperlipidemia   . Parkinson's disease ([place])   . Skin cancer, basal cell   . Sleep disorder   . Unspecified essential hypertension     Surgical History: Past Surgical History:  Procedure Laterality Date  . CATARACT EXTRACTION Bilateral     Home Medications:  Allergies as of 03/05/2018      Reactions   Phenobarbital Other (See Comments)   Overall restlessness      Medication List       Accurate as of March 05, 2018  3:15 PM. Always use your most recent med list.        B-12 2500 MCG Tabs Take by mouth.   carbidopa-levodopa 25-100 MG tablet Commonly known as:  SINEMET IR Take 1 tablet by mouth 6 (six) times daily. At 7, 10, 1 PM, 4 PM, 7 PM, and 10 PM.   carbidopa-levodopa 50-200 MG tablet Commonly known as:  SINEMET CR Take 1 tablet by mouth at bedtime.   clopidogrel 75 MG tablet Commonly known as:  PLAVIX Take 75 mg by mouth daily.   furosemide 20 MG tablet Commonly known as:  LASIX Take 20 mg by mouth daily.   magnesium hydroxide 400 MG/5ML suspension Commonly known as:  MILK OF MAGNESIA Take by mouth.   pantoprazole 20 MG tablet Commonly known as:  PROTONIX Take 40 mg by mouth daily.   polyethylene glycol  packet Commonly known as:  MIRALAX / GLYCOLAX Take 17 g by mouth daily.   PROBIOTIC PO Take by mouth.   SIMVASTATIN PO Take 20 mg by mouth daily.       Allergies:  Allergies  Allergen Reactions  . Phenobarbital Other (See Comments)    Overall restlessness    Family History: Family History  Problem Relation Age of Onset  . Heart failure Mother   . Hyperlipidemia Mother   . Heart attack Father   . Diabetes Father   . Brain cancer Sister   . Psychiatric Illness Sister   . Stroke Sister   . Deep vein thrombosis Sister   . Diabetes Brother     Social History:  reports that she has never smoked. She has never used smokeless tobacco. She reports that  she does not drink alcohol or use drugs.  ROS: UROLOGY Frequent Urination?: No Hard to postpone urination?: No Burning/pain with urination?: No Get up at night to urinate?: Yes Leakage of urine?: No Urine stream starts and stops?: No Trouble starting stream?: No Do you have to strain to urinate?: No Blood in urine?: No Urinary tract infection?: No Sexually transmitted disease?: No Injury to kidneys or bladder?: No Painful intercourse?: No Weak stream?: No Currently pregnant?: No Vaginal bleeding?: No Last menstrual period?: n  Gastrointestinal Nausea?: No Vomiting?: No Indigestion/heartburn?: No Diarrhea?: No Constipation?: No  Constitutional Fever: No Night sweats?: No Weight loss?: No Fatigue?: No  Skin Skin rash/lesions?: No Itching?: No  Eyes Blurred vision?: No Double vision?: No  Ears/Nose/Throat Sore throat?: No Sinus problems?: No  Hematologic/Lymphatic Swollen glands?: No Easy bruising?: No  Cardiovascular Leg swelling?: No Chest pain?: No  Respiratory Cough?: No Shortness of breath?: No  Endocrine Excessive thirst?: No  Musculoskeletal Back pain?: No Joint pain?: No  Neurological Headaches?: No Dizziness?: No  Psychologic Depression?: No Anxiety?: No  Physical Exam: BP (!) 151/74 (BP Location: Left Arm, Patient Position: Sitting, Cuff Size: Normal)   Pulse 66   Ht 5\' 6"  (1.676 m)   Wt 143 lb 6.4 oz (65 kg)   BMI 23.15 kg/m   Constitutional: Well nourished. Alert and oriented, No acute distress.  Accompanied by husband. Cardiovascular: No clubbing, cyanosis, or edema. Respiratory: Normal respiratory effort, no increased work of breathing. GI: Abdomen is non tender. GU: No CVA tenderness. Skin: No rashes, bruises or suspicious lesions. Neurologic: Grossly intact, no focal deficits, moving all 4 extremities.  Bilateral upper extremity tremor appreciated with masked face. Psychiatric: Normal mood and affect.   Laboratory  Data:  08/15/2017 WBC   5.5 HGB  13.1 HCT  41.0 MCV  89.5 PLT 202  Creatinine 08/15/2017 0.9  HgbA1C 08/15/2017 5.2  Urinalysis LEU 1+, 0-5 WBC.  Otherwise negative.  See Epic.  Relevant imaging  Results for orders placed or performed in visit on 03/05/18  BLADDER SCAN AMB NON-IMAGING  Result Value Ref Range   Scan Result 14     Assessment & Plan:    1. Urinary frequency/urgency - Adiquite emptying - No evidence of UTI -Primary bother is related to nocturia, see below  2. Nocturia - Likely multifactorial including multiple medical comorbidities, age, behavior and pharmacotherapy - Explained to patient that this may be primary nocturia, which is a normal consequence of aging; explained that there are medicines, but they affect brain chemistry.   -Differential diagnosis also includes OAB which is common in the setting of Parkinson's disease - Discussed behavioral modifications - Would like her to keep  a voiding diary for several days to determine if it is primary nocturia or bladder overactivity -She is unsure whether or not she like to pursue pharmacotherapy, will review voiding diary to assess whether this is primarily irritative voiding symptoms at nighttime versus primary nocturia - Options in the future may include beta 3 agonist or dvap type medication, depending on voiding diary results  Return in about 2 weeks (around 03/19/2018) for with Larene Beach to go over voiding diary.   [facility] [address], [employer] [place], [place] [address] [phone]  I, Adele Schilder, am acting as a scribe for Hollice Espy, MD.    I have reviewed the above documentation for accuracy and completeness, and I agree with the above.   Hollice Espy, MD

## 2018-03-05 ENCOUNTER — Encounter: Payer: Self-pay | Admitting: Urology

## 2018-03-05 ENCOUNTER — Ambulatory Visit (INDEPENDENT_AMBULATORY_CARE_PROVIDER_SITE_OTHER): Payer: Medicare Other | Admitting: Urology

## 2018-03-05 VITALS — BP 151/74 | HR 66 | Ht 66.0 in | Wt 143.4 lb

## 2018-03-05 DIAGNOSIS — R350 Frequency of micturition: Secondary | ICD-10-CM

## 2018-03-05 DIAGNOSIS — R351 Nocturia: Secondary | ICD-10-CM | POA: Diagnosis not present

## 2018-03-05 LAB — URINALYSIS, COMPLETE
Bilirubin, UA: NEGATIVE
Glucose, UA: NEGATIVE
Ketones, UA: NEGATIVE
Nitrite, UA: NEGATIVE
Protein, UA: NEGATIVE
RBC, UA: NEGATIVE
Specific Gravity, UA: 1.02 (ref 1.005–1.030)
Urobilinogen, Ur: 1 mg/dL (ref 0.2–1.0)
pH, UA: 7 (ref 5.0–7.5)

## 2018-03-05 LAB — MICROSCOPIC EXAMINATION: RBC, UA: NONE SEEN /hpf (ref 0–2)

## 2018-03-05 LAB — BLADDER SCAN AMB NON-IMAGING: Scan Result: 14

## 2018-03-05 NOTE — Patient Instructions (Signed)
Patient Name:______________________________________  Tracking your bladder symptoms Sample: Day Daytime Voids Number of Accidents Nighttime voids Urgenty for the day (0-4) Comments  Mon IIII 4 I 1 II 2 2 0=none-4=severe I had more coffee than usual today.   Week Starting:____________________________________ Day Daytime Voids Number of Accidents Nighttime voids Urgency for the day (0-4) Comments                                                                        This week my symptoms were:  O much better  O better O the same O worse  Week Starting:____________________________________ Day Daytime Voids Number of Accidents Nighttime voids Urgency for the day (0-4) Comments                                                                        This week my symptoms were:  O much better  O better O the same O worse

## 2018-03-21 ENCOUNTER — Ambulatory Visit: Payer: Medicare Other | Admitting: Urology

## 2018-03-26 NOTE — Progress Notes (Signed)
03/27/2018 2:52 PM   Tiffany Ramirez [birth year] [id number]  Referring provider: Rusty Aus, MD [facility] [place], [place] [address]  Chief Complaint  Patient presents with  . Urinary Frequency    HPI: Tiffany Ramirez is a 85 y.o. female Caucasian with urinary frequency and urgency x 5 years, mild sleep apnea (2013, could not tolerate CPAP), DM, and Parkinson's disease who reports today to go over her voiding diary.  She is accompanied today by her daughter.  Patient first was seen at this clinic by Dr. Erlene Quan on 03/06/2018, at which point she reported nocturia 3-4 x, which left her feeling unrested due to how frequently she was having to get up.  She denied any daytime symptoms, accidents, pedal edema, gross hematuria, dysuria, and suprapubic pain.  She did report that she urinated 'a lot' and that she felt like she was emptying but still felt bladder pressure.  She had not tried any medications.  Patient reports she only takes a little water with medicine in the evening and avoids even decaffeinated tea after 7PM.  Patient denies drinking anything else that might contain caffeine and does not drink alcohol.  Patient denies being on a low sodium diet; she is on a low protein and low sodium diet.  Voiding diary below: Week Starting: 03/04/2018 Day Daytime Voids Number of Accidents Nighttime voids Urgency for the day (0-4) Comments  Thurs      5 0 1100 mL  Fri  2  0  5 0 400 mL/850 mL  Sat  2  0  6 0 600 mL/1300 mL  Sun  2  0  6 0 200 mL/1000 mL                                This week my symptoms were:  O much better  O better O the same O worse  Week Starting: 03/11/2018 Day Daytime Voids Number of Accidents Nighttime voids Urgency for the day (0-4) Comments  Mon  3  0  5 0 600 mL/1050 mL  Tues  2  0  4 0 250 mL/800 mL  Wed  3  0  4 0 450 mL/700 mL  Thur  3  0  6 0 600  mL/700 mL  Fri  2  0  4 0 600 mL/700 mL  Sat  2  0  4 0 550 mL/1000 m:            This week my symptoms were:  O much better  O better O the same O worse  PMH: Past Medical History:  Diagnosis Date  . Anxiety   . Borderline diabetic    08-2014  . Depression   . Insomnia with sleep apnea, unspecified   . Other and unspecified hyperlipidemia   . Parkinson's disease ([place])   . Skin cancer, basal cell   . Sleep disorder   . Unspecified essential hypertension     Surgical History: Past Surgical History:  Procedure Laterality Date  . CATARACT EXTRACTION Bilateral     Home Medications:  Allergies as of 03/27/2018      Reactions   Phenobarbital Other (See Comments)   Overall restlessness      Medication List       Accurate as of March 27, 2018  2:52 PM. Always use your most recent med list.  B-12 2500 MCG Tabs Take by mouth.   carbidopa-levodopa 25-100 MG tablet Commonly known as:  SINEMET IR Take 1 tablet by mouth 6 (six) times daily. At 7, 10, 1 PM, 4 PM, 7 PM, and 10 PM.   carbidopa-levodopa 50-200 MG tablet Commonly known as:  SINEMET CR Take 1 tablet by mouth at bedtime.   clopidogrel 75 MG tablet Commonly known as:  PLAVIX Take 75 mg by mouth daily.   famotidine 40 MG tablet Commonly known as:  PEPCID TAKE 1 TABLET BY MOUTH EVERY DAY AT NIGHT   ferrous gluconate 324 MG tablet Commonly known as:  FERGON Take 324 mg by mouth daily with breakfast.   furosemide 20 MG tablet Commonly known as:  LASIX Take 20 mg by mouth daily.   magnesium hydroxide 400 MG/5ML suspension Commonly known as:  MILK OF MAGNESIA Take by mouth.   mirabegron ER 50 MG Tb24 tablet Commonly known as:  MYRBETRIQ Take 1 tablet (50 mg total) by mouth daily.   pantoprazole 20 MG tablet Commonly known as:  PROTONIX Take 40 mg by mouth daily.   polyethylene glycol packet Commonly known as:  MIRALAX / GLYCOLAX Take 17 g by mouth daily.   PROBIOTIC PO Take by  mouth.   SIMVASTATIN PO Take 20 mg by mouth daily.      Allergies:  Allergies  Allergen Reactions  . Phenobarbital Other (See Comments)    Overall restlessness    Family History: Family History  Problem Relation Age of Onset  . Heart failure Mother   . Hyperlipidemia Mother   . Heart attack Father   . Diabetes Father   . Brain cancer Sister   . Psychiatric Illness Sister   . Stroke Sister   . Deep vein thrombosis Sister   . Diabetes Brother     Social History:  reports that she has never smoked. She has never used smokeless tobacco. She reports that she does not drink alcohol or use drugs.  ROS: UROLOGY Frequent Urination?: No Hard to postpone urination?: No Burning/pain with urination?: No Get up at night to urinate?: Yes Leakage of urine?: No Urine stream starts and stops?: No Trouble starting stream?: No Do you have to strain to urinate?: No Blood in urine?: No Urinary tract infection?: No Sexually transmitted disease?: No Injury to kidneys or bladder?: No Painful intercourse?: No Weak stream?: No Currently pregnant?: No Vaginal bleeding?: No Last menstrual period?: n  Gastrointestinal Nausea?: No Vomiting?: No Indigestion/heartburn?: No Diarrhea?: No Constipation?: Yes  Constitutional Fever: No Night sweats?: No Weight loss?: No Fatigue?: No  Skin Skin rash/lesions?: No Itching?: Yes  Eyes Blurred vision?: No Double vision?: No  Ears/Nose/Throat Sore throat?: No Sinus problems?: No  Hematologic/Lymphatic Swollen glands?: No Easy bruising?: Yes  Cardiovascular Leg swelling?: No Chest pain?: No  Respiratory Cough?: No Shortness of breath?: No  Endocrine Excessive thirst?: No  Musculoskeletal Back pain?: No Joint pain?: No  Neurological Headaches?: No Dizziness?: No  Psychologic Depression?: No Anxiety?: No  Physical Exam: BP (!) 146/81 (BP Location: Left Arm, Patient Position: Sitting)   Pulse (!) 55   Ht 5\' 7"   (1.702 m)   Wt 132 lb (59.9 kg)   BMI 20.67 kg/m   Constitutional: Well nourished. Alert and oriented, No acute distress. Cardiovascular: No clubbing, cyanosis, or edema. Respiratory: Normal respiratory effort, no increased work of breathing. Skin: No rashes, bruises or suspicious lesions. Neurologic: Grossly intact, no focal deficits, moving all 4 extremities. Psychiatric: Normal mood and affect.  Assessment & Plan:    1. Nocturia - Discussed with patient possibly taking desmopressin (Nocdurna) to try to limit the production of urine at night but it does carry the risk of dropping one's sodium level especially in the elderly so would like to keep this as a last resort  - Dopamine imbalance from Parkinson's and family history (daughter, granddaughter) of interstitial cystitis might also be involved - Myrbetriq 50 mg at night also discussed as it lacks the risk of low sodium - I have advised the patient of the side effects of Myrbetriq, such as: elevation in BP, urinary retention and/or HA; patient is interested in doing a trial - Sodium level taken today so we will have a baseline if she has to do a trial of Nocdurna  2. Urinary frequency/urgency - See above - Patient advised to RTC if she feels urgency during the day or changes of symptoms  Return in about 3 weeks (around 04/17/2018) for PVR and OAB questionnaire.  Zara Council, PA-C  [facility] [address], [place], [place] [address] [phone]  I, Adele Schilder, am acting as a scribe for [facility], PA-C   I have reviewed the above documentation for accuracy and completeness, and I agree with the above.    Zara Council, PA-C  I spent 25 minutes with this patient in a face to face conversation of which greater than 50% was spent in counseling and coordination of care with the patient.

## 2018-03-27 ENCOUNTER — Encounter: Payer: Self-pay | Admitting: Urology

## 2018-03-27 ENCOUNTER — Ambulatory Visit (INDEPENDENT_AMBULATORY_CARE_PROVIDER_SITE_OTHER): Payer: Medicare Other | Admitting: Urology

## 2018-03-27 VITALS — BP 146/81 | HR 55 | Ht 67.0 in | Wt 132.0 lb

## 2018-03-27 DIAGNOSIS — R351 Nocturia: Secondary | ICD-10-CM

## 2018-03-27 MED ORDER — MIRABEGRON ER 50 MG PO TB24: 50.0000 mg | ORAL_TABLET | Freq: Every day | ORAL | 0 refills | Status: DC

## 2018-03-28 LAB — SODIUM: Sodium: 139 mmol/L (ref 134–144)

## 2018-03-29 ENCOUNTER — Encounter: Payer: Self-pay | Admitting: Neurology

## 2018-04-17 ENCOUNTER — Ambulatory Visit: Payer: Medicare Other | Admitting: Urology

## 2018-05-07 ENCOUNTER — Other Ambulatory Visit: Payer: Self-pay | Admitting: *Deleted

## 2018-05-07 ENCOUNTER — Other Ambulatory Visit: Payer: Self-pay

## 2018-05-07 ENCOUNTER — Other Ambulatory Visit: Payer: Self-pay | Admitting: Internal Medicine

## 2018-05-07 DIAGNOSIS — D509 Iron deficiency anemia, unspecified: Secondary | ICD-10-CM

## 2018-05-07 DIAGNOSIS — R0602 Shortness of breath: Secondary | ICD-10-CM | POA: Insufficient documentation

## 2018-05-07 DIAGNOSIS — D500 Iron deficiency anemia secondary to blood loss (chronic): Secondary | ICD-10-CM

## 2018-05-08 ENCOUNTER — Inpatient Hospital Stay: Payer: Medicare Other

## 2018-05-08 ENCOUNTER — Inpatient Hospital Stay (HOSPITAL_BASED_OUTPATIENT_CLINIC_OR_DEPARTMENT_OTHER): Payer: Medicare Other | Admitting: Internal Medicine

## 2018-05-08 ENCOUNTER — Other Ambulatory Visit: Payer: Self-pay | Admitting: *Deleted

## 2018-05-08 ENCOUNTER — Other Ambulatory Visit: Payer: Self-pay

## 2018-05-08 ENCOUNTER — Inpatient Hospital Stay: Payer: Medicare Other | Attending: Internal Medicine

## 2018-05-08 ENCOUNTER — Encounter: Payer: Self-pay | Admitting: Internal Medicine

## 2018-05-08 VITALS — BP 146/79 | HR 68 | Temp 97.4°F | Resp 18 | Ht 67.0 in | Wt 129.0 lb

## 2018-05-08 VITALS — BP 145/77 | HR 61 | Resp 20

## 2018-05-08 DIAGNOSIS — I10 Essential (primary) hypertension: Secondary | ICD-10-CM

## 2018-05-08 DIAGNOSIS — G4700 Insomnia, unspecified: Secondary | ICD-10-CM

## 2018-05-08 DIAGNOSIS — Z85828 Personal history of other malignant neoplasm of skin: Secondary | ICD-10-CM

## 2018-05-08 DIAGNOSIS — F419 Anxiety disorder, unspecified: Secondary | ICD-10-CM

## 2018-05-08 DIAGNOSIS — E119 Type 2 diabetes mellitus without complications: Secondary | ICD-10-CM

## 2018-05-08 DIAGNOSIS — Z79899 Other long term (current) drug therapy: Secondary | ICD-10-CM | POA: Insufficient documentation

## 2018-05-08 DIAGNOSIS — R634 Abnormal weight loss: Secondary | ICD-10-CM | POA: Insufficient documentation

## 2018-05-08 DIAGNOSIS — R0600 Dyspnea, unspecified: Secondary | ICD-10-CM

## 2018-05-08 DIAGNOSIS — F329 Major depressive disorder, single episode, unspecified: Secondary | ICD-10-CM | POA: Insufficient documentation

## 2018-05-08 DIAGNOSIS — D500 Iron deficiency anemia secondary to blood loss (chronic): Secondary | ICD-10-CM

## 2018-05-08 DIAGNOSIS — D509 Iron deficiency anemia, unspecified: Secondary | ICD-10-CM

## 2018-05-08 DIAGNOSIS — E875 Hyperkalemia: Secondary | ICD-10-CM | POA: Diagnosis not present

## 2018-05-08 DIAGNOSIS — K909 Intestinal malabsorption, unspecified: Secondary | ICD-10-CM

## 2018-05-08 DIAGNOSIS — G20 Parkinson's disease: Secondary | ICD-10-CM

## 2018-05-08 DIAGNOSIS — E785 Hyperlipidemia, unspecified: Secondary | ICD-10-CM | POA: Diagnosis not present

## 2018-05-08 LAB — CBC WITH DIFFERENTIAL/PLATELET
Abs Immature Granulocytes: 0.01 10*3/uL (ref 0.00–0.07)
Basophils Absolute: 0 10*3/uL (ref 0.0–0.1)
Basophils Relative: 1 %
Eosinophils Absolute: 0 10*3/uL (ref 0.0–0.5)
Eosinophils Relative: 0 %
HCT: 37.5 % (ref 36.0–46.0)
Hemoglobin: 11.5 g/dL — ABNORMAL LOW (ref 12.0–15.0)
Immature Granulocytes: 0 %
Lymphocytes Relative: 11 %
Lymphs Abs: 0.5 10*3/uL — ABNORMAL LOW (ref 0.7–4.0)
MCH: 23.7 pg — ABNORMAL LOW (ref 26.0–34.0)
MCHC: 30.7 g/dL (ref 30.0–36.0)
MCV: 77.3 fL — ABNORMAL LOW (ref 80.0–100.0)
Monocytes Absolute: 0.4 10*3/uL (ref 0.1–1.0)
Monocytes Relative: 8 %
Neutro Abs: 4 10*3/uL (ref 1.7–7.7)
Neutrophils Relative %: 80 %
Platelets: 200 10*3/uL (ref 150–400)
RBC: 4.85 MIL/uL (ref 3.87–5.11)
RDW: 15.9 % — ABNORMAL HIGH (ref 11.5–15.5)
WBC: 5 10*3/uL (ref 4.0–10.5)
nRBC: 0 % (ref 0.0–0.2)

## 2018-05-08 LAB — URINALYSIS, COMPLETE (UACMP) WITH MICROSCOPIC
Bacteria, UA: NONE SEEN
Bilirubin Urine: NEGATIVE
Glucose, UA: NEGATIVE mg/dL
Ketones, ur: 5 mg/dL — AB
Nitrite: NEGATIVE
Protein, ur: NEGATIVE mg/dL
Specific Gravity, Urine: 1.015 (ref 1.005–1.030)
pH: 5 (ref 5.0–8.0)

## 2018-05-08 LAB — COMPREHENSIVE METABOLIC PANEL
ALT: <5 U/L (ref 0–44)
AST: 15 U/L (ref 15–41)
Albumin: 4.3 g/dL (ref 3.5–5.0)
Alkaline Phosphatase: 81 U/L (ref 38–126)
Anion gap: 7 (ref 5–15)
BUN: 17 mg/dL (ref 8–23)
CO2: 27 mmol/L (ref 22–32)
Calcium: 9.2 mg/dL (ref 8.9–10.3)
Chloride: 102 mmol/L (ref 98–111)
Creatinine, Ser: 0.87 mg/dL (ref 0.44–1.00)
GFR calc Af Amer: >60 mL/min (ref >60)
GFR calc non Af Amer: 60 mL/min — ABNORMAL LOW (ref >60)
Glucose, Bld: 121 mg/dL — ABNORMAL HIGH (ref 70–99)
Potassium: 3.8 mmol/L (ref 3.5–5.1)
Sodium: 136 mmol/L (ref 135–145)
Total Bilirubin: 0.8 mg/dL (ref 0.3–1.2)
Total Protein: 7 g/dL (ref 6.5–8.1)

## 2018-05-08 LAB — BRAIN NATRIURETIC PEPTIDE: B Natriuretic Peptide: 139 pg/mL — ABNORMAL HIGH (ref 0.0–100.0)

## 2018-05-08 LAB — SAMPLE TO BLOOD BANK

## 2018-05-08 LAB — LACTATE DEHYDROGENASE: LDH: 113 U/L (ref 98–192)

## 2018-05-08 MED ORDER — SODIUM CHLORIDE 0.9 % IV SOLN
Freq: Once | INTRAVENOUS | Status: AC
  Administered 2018-05-08: 12:00:00 via INTRAVENOUS
  Filled 2018-05-08: qty 250

## 2018-05-08 MED ORDER — SODIUM CHLORIDE 0.9 % IV SOLN
510.0000 mg | Freq: Once | INTRAVENOUS | Status: AC
  Administered 2018-05-08: 510 mg via INTRAVENOUS
  Filled 2018-05-08: qty 17

## 2018-05-08 NOTE — Progress Notes (Signed)
[place] NOTE  Patient Care Team: Rusty Aus, MD as PCP - General (Internal Medicine)  CHIEF COMPLAINTS/PURPOSE OF CONSULTATION:    HEMATOLOGY HISTORY  #Chronic iron deficient anemia- EGD/ colonoscopy- [Dr.Elliot; last- ? 2014 ;capsule-none; stool occult x2 February 2020-  # PARKINSON- on carbidopa [interaction with PO iron]; weight loss question Parkinson's versus others  HISTORY OF PRESENTING ILLNESS:  Tiffany Ramirez 85 y.o.  female has been referred to [place] for further evaluation/work-up for anemia.  Patient states she has been anemic all her life.  She has previous blood transfusions.  She states that she had been previously on p.o. iron.  Currently she is not on p.o. iron given her Parkinson's disease medication/carbidopa interaction.   Positive for weight loss which she attributes to poor p.o. intake given her diabetes/carbidopa interaction.  Blood in stools: None/ stool occult NEG.  Change in bowel habits- None Blood in urine: None Vaginal bleeding: None Difficulty swallowing: None Abnormal weight loss: 10 pounds/ 6 month.  Iron supplementation: Not on p.o. iron because of interaction with carbidopa   Review of Systems  Constitutional: Positive for malaise/fatigue and weight loss. Negative for chills, diaphoresis and fever.  HENT: Negative for nosebleeds and sore throat.   Eyes: Negative for double vision.  Respiratory: Positive for shortness of breath. Negative for cough, hemoptysis, sputum production and wheezing.   Cardiovascular: Negative for chest pain, palpitations, orthopnea and leg swelling.  Gastrointestinal: Negative for abdominal pain, blood in stool, constipation, diarrhea, heartburn, melena, nausea and vomiting.  Genitourinary: Negative for dysuria, frequency and urgency.  Musculoskeletal: Negative for back pain and joint pain.  Skin: Negative.  Negative for itching and rash.  Neurological: Positive for tremors. Negative for  dizziness, tingling, focal weakness, weakness and headaches.  Endo/Heme/Allergies: Does not bruise/bleed easily.  Psychiatric/Behavioral: Negative for depression. The patient is not nervous/anxious and does not have insomnia.     MEDICAL HISTORY:  Past Medical History:  Diagnosis Date  . Anxiety   . Borderline diabetic    08-2014  . Depression   . Insomnia with sleep apnea, unspecified   . Other and unspecified hyperlipidemia   . Parkinson's disease ([place])   . Skin cancer, basal cell   . Sleep disorder   . Unspecified essential hypertension     SURGICAL HISTORY: Past Surgical History:  Procedure Laterality Date  . CATARACT EXTRACTION Bilateral     SOCIAL HISTORY: Social History   Socioeconomic History  . Marital status: Married    Spouse name: Joe  . Number of children: 2  . Years of education: College  . Highest education level: Not on file  Occupational History  . Occupation: Retired    Comment: Materials engineer  Social Needs  . Financial resource strain: Not on file  . Food insecurity:    Worry: Not on file    Inability: Not on file  . Transportation needs:    Medical: Not on file    Non-medical: Not on file  Tobacco Use  . Smoking status: Never Smoker  . Smokeless tobacco: Never Used  Substance and Sexual Activity  . Alcohol use: No  . Drug use: No  . Sexual activity: Not on file  Lifestyle  . Physical activity:    Days per week: Not on file    Minutes per session: Not on file  . Stress: Not on file  Relationships  . Social connections:    Talks on phone: Not on file    Gets together:  Not on file    Attends religious service: Not on file    Active member of club or organization: Not on file    Attends meetings of clubs or organizations: Not on file    Relationship status: Not on file  . Intimate partner violence:    Fear of current or ex partner: Not on file    Emotionally abused: Not on file    Physically abused: Not on file    Forced sexual  activity: Not on file  Other Topics Concern  . Not on file  Social History Narrative   Patient lives at home with her husband [name]. Patient is a homemaker.   Right handed.   One cup of coffee daily.   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     FAMILY HISTORY: Family History  Problem Relation Age of Onset  . Heart failure Mother   . Hyperlipidemia Mother   . Heart attack Father   . Diabetes Father   . Brain cancer Sister   . Psychiatric Illness Sister   . Stroke Sister   . Deep vein thrombosis Sister   . Lung cancer Brother     ALLERGIES:  is allergic to phenobarbital.  MEDICATIONS:  Current Outpatient Medications  Medication Sig Dispense Refill  . carbidopa-levodopa (SINEMET CR) 50-200 MG tablet Take 1 tablet by mouth at bedtime. 90 tablet 3  . carbidopa-levodopa (SINEMET IR) 25-100 MG tablet Take 1 tablet by mouth 6 (six) times daily. At 7, 10, 1 PM, 4 PM, 7 PM, and 10 PM. 540 tablet 3  . clopidogrel (PLAVIX) 75 MG tablet Take 75 mg by mouth daily.    . Cyanocobalamin (B-12) 2500 MCG TABS Take 1,000 mcg by mouth daily.     . famotidine (PEPCID) 40 MG tablet TAKE 1 TABLET BY MOUTH EVERY DAY AT NIGHT    . furosemide (LASIX) 20 MG tablet Take 20 mg by mouth daily.    . pantoprazole (PROTONIX) 20 MG tablet Take 40 mg by mouth daily.     . polyethylene glycol (MIRALAX / GLYCOLAX) packet Take 17 g by mouth daily.    . Probiotic Product (PROBIOTIC PO) Take by mouth.    Marland Kitchen SIMVASTATIN PO Take 20 mg by mouth daily.     . magnesium hydroxide (MILK OF MAGNESIA) 400 MG/5ML suspension Take by mouth.     No current facility-administered medications for this visit.       PHYSICAL EXAMINATION:   Vitals:   05/08/18 1026  BP: (!) 146/79  Pulse: 68  Resp: 18  Temp: (!) 97.4 F (36.3 C)   Filed Weights   05/08/18 1026  Weight: 129 lb (58.5 kg)    Physical Exam   Constitutional: She is oriented to person, place, and time and well-developed, well-nourished, and in no distress.  HENT:  Head: Normocephalic and atraumatic.  Mouth/Throat: Oropharynx is clear and moist. No oropharyngeal exudate.  Eyes: Pupils are equal, round, and reactive to light.  Neck: Normal range of motion. Neck supple.  Cardiovascular: Normal rate and regular rhythm.  Pulmonary/Chest: Breath sounds normal. No respiratory distress. She has no wheezes.  Abdominal: Soft. Bowel sounds are normal. She exhibits no distension and no mass. There is no abdominal tenderness. There is no rebound and no guarding.  Musculoskeletal: Normal range of motion.        General: No tenderness or edema.  Neurological: She is alert and oriented to  person, place, and time.  Skin: Skin is warm.  Psychiatric: Affect normal.    LABORATORY DATA:  I have reviewed the data as listed Lab Results  Component Value Date   WBC 5.0 05/08/2018   HGB 11.5 (L) 05/08/2018   HCT 37.5 05/08/2018   MCV 77.3 (L) 05/08/2018   PLT 200 05/08/2018   Recent Labs    03/27/18 1447 05/08/18 0953  NA 139 136  K  --  3.8  CL  --  102  CO2  --  27  GLUCOSE  --  121*  BUN  --  17  CREATININE  --  0.87  CALCIUM  --  9.2  GFRNONAA  --  60*  GFRAA  --  >60  PROT  --  7.0  ALBUMIN  --  4.3  AST  --  15  ALT  --  <5  ALKPHOS  --  81  BILITOT  --  0.8     No results found.  Iron deficiency anemia due to chronic blood loss # Iron def anemia-hemoglobin 10-11; ferritin 7 unclear etiology.  Proceed with IV Feraheme x2-as patient is symptomatic.  # Dyspnea-question iron deficiency versus other causes.  Check chest x-ray.  Check BNP  # weight loss-question dietary restrictions from Parkinson medication? Nutrition referral.  If continues to get worse would recommend getting a CT scan of the abdomen pelvis in the next visit.  Thank you Dr.[place] for allowing me to participate in the care of your pleasant patient.  Please do not hesitate to contact me with questions or concerns in the interim.  # 45 minutes face-to-face with the patient discussing the above plan of care; more than 50% of time spent on prognosis/ natural history; counseling and coordination.  Discussed with the patient's her daughter at length.  # DISPOSITION: Add BNP. # Ferrahem today; again in 1 week. # CXR today # referral to [place]; dietician- weight loss # follow up in 6 weeks/cbc- possible Ferra[place]-Dr.B  All questions were answered. The patient knows to call the clinic with any problems, questions or concerns.    Cammie Sickle, MD 05/08/2018 4:09 PM

## 2018-05-08 NOTE — Assessment & Plan Note (Addendum)
#   Iron def anemia-hemoglobin 10-11; ferritin 7 unclear etiology.  Proceed with IV Feraheme x2-as patient is symptomatic.  # Dyspnea-question iron deficiency versus other causes.  Check chest x-ray.  Check BNP  # weight loss-question dietary restrictions from Parkinson medication? Nutrition referral.  If continues to get worse would recommend getting a CT scan of the abdomen pelvis in the next visit.  Thank you Dr.[place] for allowing me to participate in the care of your pleasant patient. Please do not hesitate to contact me with questions or concerns in the interim.  # 45 minutes face-to-face with the patient discussing the above plan of care; more than 50% of time spent on prognosis/ natural history; counseling and coordination.  Discussed with the patient's her daughter at length.  # DISPOSITION: Add BNP. # Ferrahem today; again in 1 week. # CXR today # referral to [place]; dietician- weight loss # follow up in 6 weeks/cbc- possible Ferra[place]-Dr.B

## 2018-05-09 ENCOUNTER — Inpatient Hospital Stay: Payer: Medicare Other

## 2018-05-09 NOTE — Progress Notes (Addendum)
Nutrition Assessment   Reason for Assessment:   Weight loss   ASSESSMENT:   85 year old female with Fe deficiency anemia followed by Dr. Jacinto Reap.  Past medical history of Parkinson's disease, patient reports DM but not currently on medication and noted in my chart, depression.    RD working remotely.  Called and spoke with patient.  Patient reports that she takes sinemet at 6am and eats breakfast at 6:30am (1 piece of [place] toast with peanut butter and water, coffee.  Takes medication at 9am and 12 then has lunch at 12:30 of brussel sprouts or okra, tomatoes, or green beans or sometimes scoops with salsa or spinach dip.  Takes medication again at 3pm and at 5:30pm.  Eats dinner around 6-7pm and usually has meat at this meal (chicken, salmon, roast beef).  Last night at meat chili with beans and baked potato sometimes will have a salad.  Patient checks blood glucose on regular basis. Reports that she saw that she had diabetes on my chart.  Currently not on any medication and PCP has not advised her to take medication for blood glucose   Nutrition Focused Physical Exam: deferred, phone visit   Medications: sinemet, vit b 12, pepcid, lasix, milk of mag, miralax, probiotic   Labs: glucose 121, hgb 11.5, MCV 77.3, MCH 23.7, ferritin from 4/6 7 ([facility])   Anthropometrics:   Height: 67 inches Weight: 129 lb UBW: 142 lb in Jan 2020 BMI: 20  9% weight loss in the last 4 months   Estimated Energy Needs  Kcals: 1770-2000 calories Protein: 59-71 g  Fluid: 2 L/d   NUTRITION DIAGNOSIS: Inadequate oral intake related to concern for protein intake and timing of sinemet and concern for diabetes limiting foods as evidenced by 9% weight loss in the last 4 months    INTERVENTION:  Discussed continued recommendation for patient to take sinemet at least 30 minutes prior to eating protein rich foods.  Encouraged adding protein rich food at lunch time as having protein source at breakfast  (ie peanut butter) and dinner does not seem to effect Parkinson's symptoms.  Encouraged trying plant based sources of proteins as well that have carbohydrate in them as well as protein (ie dried beans). Encouraged adding snack at 9am and 3pm of non protein rich food as taking medication at that time (fruits, grains, vegetables with dressing for added calories) Encouraged adding additional calories to foods with olive oil, dressings, condiments to provide additional calories. Encouraged adding carbohydrate rich foods (grains, fruits, starchy vegetables, etc). Explained that even folks who are monitor blood glucose still need carbohydrate rich foods in their diet.  Encouraged patient to call Dr. Ammie Ferrier office to discuss glucose and diabetes concern as patient reports she saw it on my chart.   Recommend MVI daily taking 30 minutes separate from sinemet Daughter wanted to be called as well with recommendations.   Contact information given to patient and offered phone follow-up but patient declined.  Will call RD if needed Daughter asking about additional vit/min lab test.  RD will need to follow-up with [place].    MONITORING, EVALUATION, GOAL:  Patient will consume adequate calories and protein to prevent weight from going down.  Next Visit: patient to contact me  Tiffany Ramirez B. Zenia Resides, [place], [place] Registered Dietitian [phone] (pager)

## 2018-05-13 ENCOUNTER — Encounter: Payer: Self-pay | Admitting: Internal Medicine

## 2018-05-13 NOTE — Telephone Encounter (Signed)
Spoke to pt's daughter re: regarding ongoing shortness of breath/chest pain - getting a chest x-ray that was ordered at last visit.  I asked the daughter to remind the patient to get the chest x-ray done.  Patient's daughter is awaiting to speak to PCPs office regarding her mother's symptoms.  Patient will continue get IV iron as planned with [place].   Dr. Lestine Box.

## 2018-05-14 ENCOUNTER — Other Ambulatory Visit: Payer: Self-pay | Admitting: Internal Medicine

## 2018-05-14 ENCOUNTER — Other Ambulatory Visit: Payer: Self-pay

## 2018-05-14 ENCOUNTER — Ambulatory Visit
Admission: RE | Admit: 2018-05-14 | Discharge: 2018-05-14 | Disposition: A | Discharge status: Home / Self Care | Payer: Medicare Other | Source: Ambulatory Visit | Attending: Internal Medicine | Admitting: Internal Medicine

## 2018-05-14 DIAGNOSIS — R0789 Other chest pain: Secondary | ICD-10-CM | POA: Insufficient documentation

## 2018-05-14 DIAGNOSIS — R0602 Shortness of breath: Secondary | ICD-10-CM

## 2018-05-14 HISTORY — DX: Type 2 diabetes mellitus without complications: E11.9

## 2018-05-14 MED ORDER — IOHEXOL 350 MG/ML SOLN
75.0000 mL | Freq: Once | INTRAVENOUS | Status: AC | PRN
  Administered 2018-05-14: 17:00:00 75 mL via INTRAVENOUS

## 2018-05-15 ENCOUNTER — Inpatient Hospital Stay: Payer: Medicare Other

## 2018-05-15 ENCOUNTER — Other Ambulatory Visit: Payer: Self-pay

## 2018-05-15 VITALS — BP 146/84 | HR 69 | Temp 96.9°F | Resp 18

## 2018-05-15 DIAGNOSIS — D509 Iron deficiency anemia, unspecified: Secondary | ICD-10-CM | POA: Diagnosis not present

## 2018-05-15 DIAGNOSIS — D500 Iron deficiency anemia secondary to blood loss (chronic): Secondary | ICD-10-CM

## 2018-05-15 MED ORDER — SODIUM CHLORIDE 0.9 % IV SOLN
Freq: Once | INTRAVENOUS | Status: AC
  Administered 2018-05-15: 12:00:00 via INTRAVENOUS
  Filled 2018-05-15: qty 250

## 2018-05-15 MED ORDER — SODIUM CHLORIDE 0.9 % IV SOLN
510.0000 mg | Freq: Once | INTRAVENOUS | Status: AC
  Administered 2018-05-15: 12:00:00 510 mg via INTRAVENOUS
  Filled 2018-05-15: qty 17

## 2018-06-13 DIAGNOSIS — R001 Bradycardia, unspecified: Secondary | ICD-10-CM | POA: Insufficient documentation

## 2018-06-14 DIAGNOSIS — I480 Paroxysmal atrial fibrillation: Secondary | ICD-10-CM | POA: Insufficient documentation

## 2018-06-14 DIAGNOSIS — R0789 Other chest pain: Secondary | ICD-10-CM | POA: Insufficient documentation

## 2018-06-19 ENCOUNTER — Ambulatory Visit: Payer: Medicare Other

## 2018-06-19 ENCOUNTER — Other Ambulatory Visit: Payer: Medicare Other

## 2018-06-19 ENCOUNTER — Ambulatory Visit: Payer: Medicare Other | Admitting: Oncology

## 2018-06-25 ENCOUNTER — Other Ambulatory Visit: Payer: Self-pay | Admitting: Student

## 2018-06-25 DIAGNOSIS — R0789 Other chest pain: Secondary | ICD-10-CM

## 2018-06-25 DIAGNOSIS — R630 Anorexia: Secondary | ICD-10-CM

## 2018-06-25 DIAGNOSIS — R634 Abnormal weight loss: Secondary | ICD-10-CM

## 2018-06-25 DIAGNOSIS — R6881 Early satiety: Secondary | ICD-10-CM

## 2018-07-03 ENCOUNTER — Ambulatory Visit
Admission: RE | Admit: 2018-07-03 | Discharge: 2018-07-03 | Disposition: A | Discharge status: Home / Self Care | Payer: Medicare Other | Source: Ambulatory Visit | Attending: Student | Admitting: Student

## 2018-07-03 ENCOUNTER — Other Ambulatory Visit: Payer: Self-pay

## 2018-07-03 DIAGNOSIS — R6881 Early satiety: Secondary | ICD-10-CM | POA: Insufficient documentation

## 2018-07-03 DIAGNOSIS — R634 Abnormal weight loss: Secondary | ICD-10-CM | POA: Insufficient documentation

## 2018-07-03 DIAGNOSIS — R630 Anorexia: Secondary | ICD-10-CM | POA: Insufficient documentation

## 2018-07-03 DIAGNOSIS — R0789 Other chest pain: Secondary | ICD-10-CM

## 2018-07-08 ENCOUNTER — Telehealth: Payer: Self-pay | Admitting: Neurology

## 2018-07-08 NOTE — Telephone Encounter (Signed)
Notation: Pt daughter Manuela Neptune [place]. Confirmed Ms. Tiffany Ramirez is on the DPR. Tiffany Ramirez requesting second opinion consultation with Dr. Carles Collet for Parkinson's. Tiffany Ramirez will request new referral from PCP. Advised once received Tat will review records to determine if appt should be made.

## 2018-07-24 ENCOUNTER — Other Ambulatory Visit: Payer: Self-pay

## 2018-07-24 ENCOUNTER — Ambulatory Visit (INDEPENDENT_AMBULATORY_CARE_PROVIDER_SITE_OTHER): Payer: Medicare Other | Admitting: Neurology

## 2018-07-24 ENCOUNTER — Encounter: Payer: Self-pay | Admitting: Neurology

## 2018-07-24 VITALS — BP 150/78 | HR 63 | Ht 66.0 in | Wt 124.0 lb

## 2018-07-24 DIAGNOSIS — K5909 Other constipation: Secondary | ICD-10-CM | POA: Diagnosis not present

## 2018-07-24 DIAGNOSIS — G20 Parkinson's disease: Secondary | ICD-10-CM

## 2018-07-24 DIAGNOSIS — R600 Localized edema: Secondary | ICD-10-CM

## 2018-07-24 MED ORDER — LINACLOTIDE 145 MCG PO CAPS: 145.0000 ug | ORAL_CAPSULE | Freq: Every day | ORAL | 5 refills | Status: DC

## 2018-07-24 NOTE — Patient Instructions (Addendum)
We will With your Sinemet at 1 pill 6 times a day every 3 hours.  Please continue with his Sinemet long-acting at bedtime.  For your constipation I would like for you to try Linzess (generic name: linaclotide) 145 mcg Strength: Take 1 pill before breakfast.Side effects may include diarrhea, bloating, and abdominal pain.  Please continue your senna for now.  We can increase the Linzess if needed. Please try to hydrate well with water. You have lost quite a bit of weight over time.  Please try to add nutrition in the form of protein and protein milkshakes into your regimen, such as boost or Ensure once or twice daily at least.

## 2018-07-24 NOTE — Progress Notes (Signed)
Subjective:    Patient ID: Tiffany Ramirez is a 85 y.o. female.  HPI     Interim history:   Tiffany Ramirez is an 85 year old right-handed woman with an underlying medical history of TIA, hypertension, hyperlipidemia, and overweight state, history of complex sleep apnea and PLMD (with a remote history of RLS), who presents for followup consultation of her Parkinson's disease. She's accompanied by her daughter today. I last saw her on 02/18/2018, at which time she was taking Sinemet 5 times a day.  She reported that her tremor was worse.  She and her husband had moved into a single-story home in late 2019.  She had no recent falls thankfully.  She was advised to take Sinemet 3 hourly for total of 6 pills daily and Sinemet CR at bedtime.  Her daughter Tiffany Ramirez has recently requested an opinion with Dr. Carles Collet, appointment is pending.  The patient is also scheduled this month with Dr. Manuella Ghazi in Neurology.  Today, 07/24/2018: She reports feeling weaker, having less stamina.  She has significant constipation.  She has had bloating and chest pain and indigestion.  She saw cardiology through [facility] on 07/18/2018.  She has recently seen GI on 07/19/2018.  She has been advised to use Senna.  She also uses an enema fairly frequently.  She tries to hydrate well with water.  She has had episodes of A. fib.  She has been on Plavix.  She had an echocardiogram in May 2020.  She had upper GI series.  She has a hiatal hernia and reflux issues.  She has been started on Carafate.  She continues to take Sinemet 3 hourly, 6 times a day and a CR at night.  She has not been walking regularly.  She has needed more help, her daughter from [place] is visiting and her other daughter also takes turn to help out  The patient's allergies, current medications, family history, past medical history, past social history, past surgical history and problem list were reviewed and updated as appropriate.    Previously (copied from previous notes  for reference):      I saw her on 08/16/2017, at which time I suggested she take her Sinemet 6 times a day, every 3 hours, starting at 7 and take a Sinemet CR at bedtime around 10 or 11 PM. They were in the process of selling their home and moving into a retirement community in [place]. She was trying to stay proactive about constipation issues.   I saw her on 02/15/2017, at which time she reported feeling fairly stable, thankfully had no recent falls. She had difficulty sleeping at night. She was taking brand-name Sinemet 1-1/2 pill 4 times a day as well as Sinemet CR at bedtime.    Her insurance stopped covering the brand-name Sinemet.    I saw her on 08/14/2016, at which time she felt fairly stable. She was trying to exercise by way of walking more. She had no recent falls. I suggested we keep her Sinemet at 1-1/2 pills 4 times a day and Sinemet CR at night.   I saw her on 11/16/2015, at which time she was taking Sinemet 4 times a day, as well as Sinemet CR at bedtime. She did call in the interim and we increased her Sinemet to 1-1/2 pills 4 times a day. She was referred to another psychiatrist in the interim. She did have a fall in or around August. She had limited her driving to local roads only. She was trying to  walk daily.   I saw her on 07/20/2015, at which time she was taking Sinemet about 3 hours apart around 7, 10, 1 PM and 4 PM. She was doing fairly well, mood was stable, she was 7 low-dose long-acting Effexor 37.5 mg daily. No recent falls reported. She was taking her Sinemet CR at 9 PM and no longer any melatonin as she felt she was sleeping better. We mutually agreed to make a referral to geriatric psychiatry.    I saw her on 02/22/2015, at which time she reported doing better overall. She had spent some time with her daughter up in [place] and this went well. She came back around Thanksgiving 2016. She felt that the Sinemet was not lasting as long and was taking it at 7 AM, 11:30  AM, and 5 PM. She was taking the CR at bedtime, around 11 PM. She was also taking melatonin 6 mg at bedtime. She had been on Effexor XR 37.5 mg once daily in the morning. She did have an episode of double vision in November while she was staying with her daughter and saw an ophthalmologist and then a neuro-ophthalmologist as I understand. She had an MRI brain with and without contrast on 12/07/2014: Findings consistent with moderate chronic small vessel ischemic disease. Moderate atrophy along the vertex with prominent CSF signal along the medial right and left frontal lobes. No acute infarct, hemorrhage or enhancing mass. She had blood work on 02/03/2015: Hemoglobin A1c was 5.1, CBC with differential unremarkable, lipid profile showed total cholesterol of 171, triglycerides 203, LDL 82, HDL 48.5. Urinalysis was negative. TSH normal at 3.2 vitamin B12 elevated at greater than 1500.    I suggested we change her medication regimen slightly with Sinemet 1 pill 4 times a day at 7 AM, 10 AM, 1 PM and 5 PM, the CR at 9 PM as well as melatonin at 9 PM. Her daughter called in the interim in April 2017 reporting additional problems with mood, including rumination of thoughts and frustration. She spoke with Dr. Jannifer Franklin at the time, who suggested that her Sinemet could be changed to Sinemet CR and the Effexor could be increased.   I first met her on 11/18/2014 and she previously followed with Dr. Jim Like and prior to that with Dr. Morene Antu. At the time of her visit in October 2016 she reported doing fairly well. Her daughter reported more depression and anxiety issues as well as more stress. Tiffany Ramirez was taking her up [place] to stay with her for the next several weeks. She was no longer on Paxil as she had side effects on it. She was recently started on Wellbutrin but she was in the process of switching to Effexor XR. She had no tremors on Paxil. Mood wise, she felt Paxil was helpful however. She reported Parkinson  symptoms for about 5+ years prior. She had noted an intermittent right hand tremor in the beginning which gradually progressed. She started having issues with constipation for which she takes MiraLAX as needed. She was not exercising regularly and not always drinking enough water. I suggested she try melatonin for sleep for insomnia. I asked her to continue with Sinemet 25-100 milligrams strength one pill 3 times a day and add Sinemet CR 50-200 milligrams 1 pill at bedtime. In the interim, her daughter called on 11/30/2014 with problems with regards to her Effexor XR. She felt that she was doing worse on it. They requested an alternative prescription but [place] was [place]  at the time.   11/18/2014: This is her first visit with me and she previously followed with Dr. Jim Like and prior to that with Dr. Morene Antu. She was seen by Dr. Janann Colonel on 08/26/2012, at which time she was doing well. She was taking Sinemet 3 times a day, at 6:30 AM, 11:30 AM and 5:30 PM. She had right-sided more than left-sided symptoms, tremor predominant. She had intermittent possible dyskinesias of her left foot. She had some constipation.   She was seen recently by [name], nurse practitioner on 09/21/2014, at which time she was kept on her medication regimen.   Dr. Morene Antu (02/12/12): He felt she was doing very well. She was to resume an exercise program and did not have any medication changes at the time. Her falls assessment tool score at the time was 8. She has an underlying medical history of stroke, hypertension, hyperlipidemia. She is currently on Flexeril as needed, Inderal LA 80 mg daily, amlodipine-benazepril, slow FE, calcium vitamin D, baby aspirin, Protonix, Plavix, simvastatin, Sinemet 25/100 mg strength one tablet 3 times a day.   I reviewed Dr. Tressia Danas prior notes and the patient's records and below is a summary of that review:   85 year old right-handed woman who started noticing a hand tremor some 6  years ago and was first evaluated by Dr. love on 02/01/2009. This tremor started in her right thumb. Once this test is tremor. She has been on Inderal for 5 years in my sleep for 2 years she denies memory loss, hallucinations, delusions, or depression. She has sleep study due to sleepiness reported. This was done in October 2013 and showed mild PLMs and mild sleep apnea. She was unable to tolerate CPAP. In January her MMSE was 29, clock drawing was 4, animal fluency was 20.  Her Past Medical History Is Significant For: Past Medical History:  Diagnosis Date  . Anxiety   . Borderline diabetic    08-2014  . Depression   . Diabetes mellitus without complication ([place])   . Insomnia with sleep apnea, unspecified   . Other and unspecified hyperlipidemia   . Parkinson's disease ([place])   . Skin cancer, basal cell   . Sleep disorder   . Unspecified essential hypertension     Her Past Surgical History Is Significant For: Past Surgical History:  Procedure Laterality Date  . CATARACT EXTRACTION Bilateral     Her Family History Is Significant For: Family History  Problem Relation Age of Onset  . Heart failure Mother   . Hyperlipidemia Mother   . Heart attack Father   . Diabetes Father   . Brain cancer Sister   . Psychiatric Illness Sister   . Stroke Sister   . Deep vein thrombosis Sister   . Lung cancer Brother     Her Social History Is Significant For: Social History   Socioeconomic History  . Marital status: Married    Spouse name: Joe  . Number of children: 2  . Years of education: College  . Highest education level: Not on file  Occupational History  . Occupation: Retired    Comment: Materials engineer  Social Needs  . Financial resource strain: Not on file  . Food insecurity    Worry: Not on file    Inability: Not on file  . Transportation needs    Medical: Not on file    Non-medical: Not on file  Tobacco Use  . Smoking status: Never Smoker  . Smokeless tobacco: Never Used  Substance and Sexual Activity  . Alcohol use: No  . Drug use: No  . Sexual activity: Not on file  Lifestyle  . Physical activity    Days per week: Not on file    Minutes per session: Not on file  . Stress: Not on file  Relationships  . Social Herbalist on phone: Not on file    Gets together: Not on file    Attends religious service: Not on file    Active member of club or organization: Not on file    Attends meetings of clubs or organizations: Not on file    Relationship status: Not on file  Other Topics Concern  . Not on file  Social History Narrative   Patient lives at home with her husband [name]. Patient is a homemaker.   Right handed.   One cup of coffee daily.   -----------------------------------------------     In [place]; no smoker; ocassional; alcohol; clerk retd. Daughter Leeb/dental hygienist in [place].     Her Allergies Are:  Allergies  Allergen Reactions  . Phenobarbital Other (See Comments)    Overall restlessness  :   Her Current Medications Are:  Outpatient Encounter Medications as of 07/24/2018  Medication Sig  . carbidopa-levodopa (SINEMET CR) 50-200 MG tablet Take 1 tablet by mouth at bedtime.  . carbidopa-levodopa (SINEMET IR) 25-100 MG tablet Take 1 tablet by mouth 6 (six) times daily. At 7, 10, 1 PM, 4 PM, 7 PM, and 10 PM.  . clopidogrel (PLAVIX) 75 MG tablet Take 75 mg by mouth daily.  . hydrochlorothiazide (HYDRODIURIL) 12.5 MG tablet Take 12.5 mg by mouth daily.  . pantoprazole (PROTONIX) 20 MG tablet Take 40 mg by mouth daily.   . Probiotic Product (PROBIOTIC PO) Take by mouth.  Marland Kitchen SIMVASTATIN PO Take 20 mg by mouth daily.   . sucralfate (CARAFATE) 1 GM/10ML suspension Take by mouth.  . [DISCONTINUED] Cyanocobalamin (B-12) 2500 MCG TABS Take 1,000 mcg by mouth daily.   . [DISCONTINUED] famotidine (PEPCID) 40 MG tablet TAKE 1 TABLET BY MOUTH EVERY DAY AT NIGHT  . [DISCONTINUED] furosemide (LASIX) 20 MG tablet Take 20 mg by mouth daily.   . [DISCONTINUED] magnesium hydroxide (MILK OF MAGNESIA) 400 MG/5ML suspension Take by mouth.  . [DISCONTINUED] polyethylene glycol (MIRALAX / GLYCOLAX) packet Take 17 g by mouth daily.   No facility-administered encounter medications on file as of 07/24/2018.   :  Review of Systems:  Out of a complete 14 point review of systems, all are reviewed and negative with the exception of these symptoms as listed below: Review of Systems  Neurological:       Pt presents today to discuss her PD. Pt reports that she has some bouts of afib.    Objective:  Neurological Exam  Physical Exam Physical Examination:   Vitals:   07/24/18 1502  BP: (!) 150/78  Pulse: 63   General Examination: The patient is a very pleasant 85 y.o. female in no acute distress. She appears well-developed and well-nourished and well groomed.   HEENT:Normocephalic, atraumatic, pupils are equal, round and reactive to light and accommodation, status post bilateral cataract repairs. Extraocular tracking shows moderate saccadic breakdown. She has no nystagmus. She has a mild decrease in eye blink rate and mild to moderatefacial masking. Hearing is mildly impaired. Speech is moderately hypophonic. She has an intermittent lower lip and jaw tremor, unchanged. Neck is mildly rigid. Oropharynx exam shows mildmouth dryness, otherwise fine. Tongue protrudes centrally and  palate elevates symmetrically.   Chest:is clear to auscultation without wheezing, rhonchi or crackles noted.   Heart:sounds are regular and normal without murmurs, rubs or gallops noted.   Abdomen:is soft, non-tender and non-distended.  Extremities:There istraceedema in the distal lower extremities bilaterally, R ankle more in the 1+ range.  Skin: is warm and dry with chronic stasis type changes and mild discoloration of the distal lower extremities and feet. Age-related changes are noted on the skin,chronic appearing mildbruising of forearms and  hands.   Musculoskeletal: exam reveals no obvious joint deformities, tenderness, joint swelling or erythema.   Neurologically:  Mental status: The patient is awake and alert, paying good attention. She is able to provide the history. Her husband supplements Hx.The patient's memory, attention, language and knowledgeare quite well preserved. Shehasmild bradyphrenia.   Cranial nerves are as described above under HEENT exam. In addition, shoulder shrug is normal with slightly unequal shoulder height noted, right higher than left.   Motor exam: Normal bulk, and strength for age is noted. There aremild upper bodydyskinesias noted. She has a moderate resting tremor in the right more than L upper extremity. Fine motor skills are moderately impaired with finger taps, hand movements and rapid alternating patting, right more than L. Foot agility moderately impaired on the right and mildly so on the left. She stands up with mild difficulty. Her posture is moderately stooped. She has a slight tilt to the right, fairly unchanged. She walks with decreased stride length and mildly decreased pace and decreased arm swing on theR>L.She turns in 3 steps.Slight steppage R foot.Reflexes are trace in the lower extremities and 1+ in the upper extremities. Sensory exam is intact to light touch, But decreased to temperature sense in the distal lower extremities bilaterally. Tandem walk is not possible for her.   Assessment and Plan:   In summary, Tiffany Ramirez is a very pleasant 85 year old female with an underlying medical history of TIA, hypertension, hyperlipidemia, history of complex sleep apnea, PLMD a remote history of restless leg syndrome, Recent diagnosis of paroxysmal A. fib, reflux disease, hiatal hernia, and weight loss, Who presents for follow-up consultation of her advanced Parkinson's disease, with an approximately 7 to 8-year history of right-sided predominant PD, complicated by significant  constipation at this time. She was on lower dose levodopa for quite some time and managed well for several years. She had an interim issues with stress level, anxiety and depression, all of which have actually improvedwith time. [place]o longer is onEffexor long-acting 37.5 mg daily and no longer sees the counselor,had seen psychiatryin the pastas well. For constipation she was taking MiraLAX prn And had work-up recently through cardiology as well as GI.  She was advised by her GI specialist to stop the MiraLAX and start senna.  She was also given Carafate.  She is advised to try Linzess for chronic constipation.  I talked to the patient and her daughter about expectations, and possible side effects. She has Continued to lose weight.  She is advised to supplement her diet with protein milkshakes if possible. She is reminded to stay well hydrated with water. She can continue with Sinemet one pill every 3 hours starting at 7 AM for a total of 6 pills, and Sinemet CR at bedtime. I suggested a 3 month follow-up, sooner if needed. She has an appointment with Dr. Manuella Ghazi this month.  I answered all their questions today and the patient and her daughter were in agreement. I spent 40 minutes in total  face-to-face time with the patient, more than 50% of which was spent in counseling and coordination of care, reviewing test results, reviewing medication and discussing or reviewing the diagnosis of PD, its prognosis and treatment options. Pertinent laboratory and imaging test results that were available during this visit with the patient were reviewed by me and considered in my medical decision making (see chart for details).

## 2018-07-29 ENCOUNTER — Other Ambulatory Visit: Payer: Self-pay | Admitting: Student

## 2018-07-29 ENCOUNTER — Other Ambulatory Visit: Payer: Self-pay

## 2018-07-29 ENCOUNTER — Ambulatory Visit
Admission: RE | Admit: 2018-07-29 | Discharge: 2018-07-29 | Disposition: A | Discharge status: Home / Self Care | Payer: Medicare Other | Source: Ambulatory Visit | Attending: Student | Admitting: Student

## 2018-07-29 DIAGNOSIS — R079 Chest pain, unspecified: Secondary | ICD-10-CM

## 2018-07-29 DIAGNOSIS — K5909 Other constipation: Secondary | ICD-10-CM

## 2018-07-29 DIAGNOSIS — R634 Abnormal weight loss: Secondary | ICD-10-CM

## 2018-07-29 MED ORDER — IOHEXOL 300 MG/ML  SOLN
75.0000 mL | Freq: Once | INTRAMUSCULAR | Status: AC | PRN
  Administered 2018-07-29: 75 mL via INTRAVENOUS

## 2018-07-30 ENCOUNTER — Other Ambulatory Visit: Payer: Self-pay | Admitting: Student

## 2018-07-30 DIAGNOSIS — R1013 Epigastric pain: Secondary | ICD-10-CM

## 2018-08-12 ENCOUNTER — Other Ambulatory Visit: Payer: Self-pay

## 2018-08-12 ENCOUNTER — Emergency Department: Payer: Medicare Other

## 2018-08-12 ENCOUNTER — Emergency Department
Admission: EM | Admit: 2018-08-12 | Discharge: 2018-08-12 | Disposition: A | Discharge status: Home / Self Care | Payer: Medicare Other | Attending: Emergency Medicine | Admitting: Emergency Medicine

## 2018-08-12 DIAGNOSIS — Z85828 Personal history of other malignant neoplasm of skin: Secondary | ICD-10-CM | POA: Insufficient documentation

## 2018-08-12 DIAGNOSIS — R109 Unspecified abdominal pain: Secondary | ICD-10-CM | POA: Diagnosis present

## 2018-08-12 DIAGNOSIS — E119 Type 2 diabetes mellitus without complications: Secondary | ICD-10-CM | POA: Diagnosis not present

## 2018-08-12 DIAGNOSIS — R1013 Epigastric pain: Secondary | ICD-10-CM | POA: Diagnosis not present

## 2018-08-12 DIAGNOSIS — I10 Essential (primary) hypertension: Secondary | ICD-10-CM | POA: Diagnosis not present

## 2018-08-12 DIAGNOSIS — G20 Parkinson's disease: Secondary | ICD-10-CM | POA: Diagnosis not present

## 2018-08-12 DIAGNOSIS — Z7901 Long term (current) use of anticoagulants: Secondary | ICD-10-CM | POA: Diagnosis not present

## 2018-08-12 DIAGNOSIS — K219 Gastro-esophageal reflux disease without esophagitis: Secondary | ICD-10-CM

## 2018-08-12 DIAGNOSIS — Z79899 Other long term (current) drug therapy: Secondary | ICD-10-CM | POA: Insufficient documentation

## 2018-08-12 LAB — CBC WITH DIFFERENTIAL/PLATELET
Abs Immature Granulocytes: 0.01 10*3/uL (ref 0.00–0.07)
Basophils Absolute: 0.1 10*3/uL (ref 0.0–0.1)
Basophils Relative: 1 %
Eosinophils Absolute: 0.1 10*3/uL (ref 0.0–0.5)
Eosinophils Relative: 1 %
HCT: 39.3 % (ref 36.0–46.0)
Hemoglobin: 13.3 g/dL (ref 12.0–15.0)
Immature Granulocytes: 0 %
Lymphocytes Relative: 21 %
Lymphs Abs: 1 10*3/uL (ref 0.7–4.0)
MCH: 29.8 pg (ref 26.0–34.0)
MCHC: 33.8 g/dL (ref 30.0–36.0)
MCV: 88.1 fL (ref 80.0–100.0)
Monocytes Absolute: 0.5 10*3/uL (ref 0.1–1.0)
Monocytes Relative: 10 %
Neutro Abs: 3.2 10*3/uL (ref 1.7–7.7)
Neutrophils Relative %: 67 %
Platelets: 178 10*3/uL (ref 150–400)
RBC: 4.46 MIL/uL (ref 3.87–5.11)
RDW: 15.1 % (ref 11.5–15.5)
WBC: 4.9 10*3/uL (ref 4.0–10.5)
nRBC: 0 % (ref 0.0–0.2)

## 2018-08-12 LAB — COMPREHENSIVE METABOLIC PANEL
ALT: <5 U/L (ref 0–44)
AST: 16 U/L (ref 15–41)
Albumin: 4.4 g/dL (ref 3.5–5.0)
Alkaline Phosphatase: 61 U/L (ref 38–126)
Anion gap: 8 (ref 5–15)
BUN: 11 mg/dL (ref 8–23)
CO2: 32 mmol/L (ref 22–32)
Calcium: 9.3 mg/dL (ref 8.9–10.3)
Chloride: 98 mmol/L (ref 98–111)
Creatinine, Ser: 0.6 mg/dL (ref 0.44–1.00)
GFR calc Af Amer: >60 mL/min (ref >60)
GFR calc non Af Amer: >60 mL/min (ref >60)
Glucose, Bld: 107 mg/dL — ABNORMAL HIGH (ref 70–99)
Potassium: 2.8 mmol/L — ABNORMAL LOW (ref 3.5–5.1)
Sodium: 138 mmol/L (ref 135–145)
Total Bilirubin: 1.2 mg/dL (ref 0.3–1.2)
Total Protein: 6.5 g/dL (ref 6.5–8.1)

## 2018-08-12 LAB — TROPONIN I (HIGH SENSITIVITY): Troponin I (High Sensitivity): 8 ng/L (ref <18)

## 2018-08-12 LAB — LIPASE, BLOOD: Lipase: 25 U/L (ref 11–51)

## 2018-08-12 MED ORDER — POTASSIUM CHLORIDE 20 MEQ PO PACK
40.0000 meq | PACK | Freq: Once | ORAL | Status: AC
  Administered 2018-08-12: 40 meq via ORAL
  Filled 2018-08-12: qty 2

## 2018-08-12 MED ORDER — ONDANSETRON 4 MG PO TBDP: 4.0000 mg | ORAL_TABLET | Freq: Three times a day (TID) | ORAL | 0 refills | Status: AC | PRN

## 2018-08-12 MED ORDER — ONDANSETRON HCL 4 MG/2ML IJ SOLN
4.0000 mg | Freq: Once | INTRAMUSCULAR | Status: AC
Start: 2018-08-12 — End: 2018-08-12
  Administered 2018-08-12: 4 mg via INTRAVENOUS
  Filled 2018-08-12: qty 2

## 2018-08-12 MED ORDER — ALUM & MAG HYDROXIDE-SIMETH 400-400-40 MG/5ML PO SUSP: 5.0000 mL | Freq: Four times a day (QID) | ORAL | 0 refills | Status: AC | PRN

## 2018-08-12 MED ORDER — POTASSIUM CHLORIDE CRYS ER 20 MEQ PO TBCR: 40.0000 meq | EXTENDED_RELEASE_TABLET | Freq: Once | ORAL | Status: DC

## 2018-08-12 MED ORDER — LIDOCAINE VISCOUS HCL 2 % MT SOLN
15.0000 mL | Freq: Once | OROMUCOSAL | Status: AC
  Administered 2018-08-12: 15 mL via ORAL
  Filled 2018-08-12: qty 15

## 2018-08-12 MED ORDER — FAMOTIDINE IN NACL 20-0.9 MG/50ML-% IV SOLN
20.0000 mg | Freq: Once | INTRAVENOUS | Status: AC
  Administered 2018-08-12: 02:00:00 20 mg via INTRAVENOUS
  Filled 2018-08-12: qty 50

## 2018-08-12 MED ORDER — ALUM & MAG HYDROXIDE-SIMETH 200-200-20 MG/5ML PO SUSP
30.0000 mL | Freq: Once | ORAL | Status: AC
  Administered 2018-08-12: 30 mL via ORAL
  Filled 2018-08-12: qty 30

## 2018-08-12 MED ORDER — ONDANSETRON HCL 4 MG/2ML IJ SOLN
4.0000 mg | Freq: Once | INTRAMUSCULAR | Status: AC
  Administered 2018-08-12: 4 mg via INTRAVENOUS
  Filled 2018-08-12: qty 2

## 2018-08-12 NOTE — ED Notes (Signed)
When asked if pt has thoughts of wanting to her herself or others she reports "not others". When I asked pt what she meant she states "I am tired of this, it is a good thing we dont have guns and I dont know how to use one".

## 2018-08-12 NOTE — ED Triage Notes (Signed)
Pt arrives via [facility] from home for upper abdominal pain that has worsened over the past 3 days. Pt reports this has been going on intermittently over the past 3 months. Pain starts after eating or when laying down. Pt reports pain improves when lying on right side. Pt is in NAD at this time and is A&Ox4

## 2018-08-12 NOTE — ED Notes (Signed)
E signature pad not working. Pts daughter signed paper discharge document. Pt educated on medications and discharge instructions and verbalized understanding.

## 2018-08-12 NOTE — ED Notes (Signed)
[phone], rebecca, daughter called [facility] [place]

## 2018-08-12 NOTE — ED Notes (Signed)
Daughter arrived, blue pants on pt, pt requesting nausea meds

## 2018-08-12 NOTE — Discharge Instructions (Addendum)

## 2018-08-12 NOTE — ED Provider Notes (Signed)
[facility] Emergency Department Provider Note  ____________________________________________  Time seen: Approximately 1:39 AM  I have reviewed the triage vital signs and the nursing notes.   HISTORY  Chief Complaint Abdominal Pain   HPI Tiffany Ramirez is a 85 y.o. female with a history of diabetes, Parkinson's disease, hypertension, hiatal hernia who presents for evaluation of abdominal pain.  Patient has been having the same abdominal pain for 3 months.  Has been seen by her primary care doctor with labs and CT scan done 2 weeks ago. Pain has been intermittent for 3 months.  Over the last week pain has been more persistent.  Since yesterday pain has been constant and more severe.  The pain is worse when she eats.  She describes the pain as a burning sensation located in her epigastric region that is nonradiating.  At this time the pain is 4 out of 10.  She has had nausea and dry heaving but no vomiting.  She feels constipated.  She is passing flatus.  She denies any prior history of SBO or abdominal distention.  She is currently on Augmentin for questionable diverticulitis seen on CT 2 weeks ago.  She is also been started on famotidine which does not help.  Patient reports that when she lays on her right side the pain goes away. No CP or SOB.  Past Medical History:  Diagnosis Date  . Anxiety   . Borderline diabetic    08-2014  . Depression   . Diabetes mellitus without complication ([place])   . Insomnia with sleep apnea, unspecified   . Other and unspecified hyperlipidemia   . Parkinson's disease ([place])   . Skin cancer, basal cell   . Sleep disorder   . Unspecified essential hypertension     Patient Active Problem List   Diagnosis Date Noted  . Iron malabsorption 05/08/2018  . Iron deficiency anemia due to chronic blood loss 05/07/2018  . Swelling of limb 12/28/2015  . Chronic hyperglycemia 08/13/2014  . Paralysis agitans ([place]) 08/28/2013  . HLD  (hyperlipidemia) 07/28/2013  . Arterial vascular disease 07/28/2013  . Insomnia with sleep apnea   . Sleep disorder   . Essential hypertension     Past Surgical History:  Procedure Laterality Date  . CATARACT EXTRACTION Bilateral     Prior to Admission medications   Medication Sig Start Date End Date Taking? Authorizing Provider  alum & mag hydroxide-simeth (MAALOX MAX) 400-400-40 MG/5ML suspension Take 5 mLs by mouth every 6 (six) hours as needed for indigestion. 08/12/18   Rudene Re, MD  carbidopa-levodopa (SINEMET CR) 50-200 MG tablet Take 1 tablet by mouth at bedtime. 02/18/18   Star Age, MD  carbidopa-levodopa (SINEMET IR) 25-100 MG tablet Take 1 tablet by mouth 6 (six) times daily. At 7, 10, 1 PM, 4 PM, 7 PM, and 10 PM. 02/18/18   Star Age, MD  clopidogrel (PLAVIX) 75 MG tablet Take 75 mg by mouth daily.    [provider]  hydrochlorothiazide (HYDRODIURIL) 12.5 MG tablet Take 12.5 mg by mouth daily. 07/18/18   [provider]  linaclotide Rolan Lipa) 145 MCG CAPS capsule Take 1 capsule (145 mcg total) by mouth daily before breakfast. 07/24/18   Star Age, MD  ondansetron (ZOFRAN ODT) 4 MG disintegrating tablet Take 1 tablet (4 mg total) by mouth every 8 (eight) hours as needed. 08/12/18   Alfred Levins, [place], MD  pantoprazole (PROTONIX) 20 MG tablet Take 40 mg by mouth daily.  [provider]  Probiotic Product (PROBIOTIC PO) Take by mouth.    [provider]  SIMVASTATIN PO Take 20 mg by mouth daily.     [provider]  sucralfate (CARAFATE) 1 GM/10ML suspension Take by mouth. 07/17/18 07/17/19  [provider]    Allergies Phenobarbital  Family History  Problem Relation Age of Onset  . Heart failure Mother   . Hyperlipidemia Mother   . Heart attack Father   . Diabetes Father   . Brain cancer Sister   . Psychiatric Illness Sister   . Stroke Sister   . Deep vein thrombosis Sister   . Lung cancer Brother      Social History Social History   Tobacco Use  . Smoking status: Never Smoker  . Smokeless tobacco: Never Used  Substance Use Topics  . Alcohol use: No  . Drug use: No    Review of Systems  Constitutional: Negative for fever. Eyes: Negative for visual changes. ENT: Negative for sore throat. Neck: No neck pain  Cardiovascular: Negative for chest pain. Respiratory: Negative for shortness of breath. Gastrointestinal: + abdominal pain and nausea. no vomiting or diarrhea. Genitourinary: Negative for dysuria. Musculoskeletal: Negative for back pain. Skin: Negative for rash. Neurological: Negative for headaches, weakness or numbness. Psych: No SI or HI  ____________________________________________   PHYSICAL EXAM:  VITAL SIGNS: Vitals:   08/12/18 0230 08/12/18 0317  BP: (!) 148/67 137/72  Pulse:  (!) 59  Resp: 14 19  Temp:    SpO2:  98%    Constitutional: Alert and oriented. Well appearing and in no apparent distress. HEENT:      Head: Normocephalic and atraumatic.         Eyes: Conjunctivae are normal. Sclera is non-icteric.       Mouth/Throat: Mucous membranes are moist.       Neck: Supple with no signs of meningismus. Cardiovascular: Regular rate and rhythm. No murmurs, gallops, or rubs. 2+ symmetrical distal pulses are present in all extremities. No JVD. Respiratory: Normal respiratory effort. Lungs are clear to auscultation bilaterally. No wheezes, crackles, or rhonchi.  Gastrointestinal: Soft, mild epigastric tenderness, negative Murphy's sign, and non distended with positive bowel sounds. No rebound or guarding. Genitourinary: No CVA tenderness. Musculoskeletal: Nontender with normal range of motion in all extremities. No edema, cyanosis, or erythema of extremities. Neurologic: Normal speech and language. Face is symmetric. Moving all extremities. No gross focal neurologic deficits are appreciated. Skin: Skin is warm, dry and intact. No rash  noted. Psychiatric: Mood and affect are normal. Speech and behavior are normal.  ____________________________________________   LABS (all labs ordered are listed, but only abnormal results are displayed)  Labs Reviewed  COMPREHENSIVE METABOLIC PANEL - Abnormal; Notable for the following components:      Result Value   Potassium 2.8 (*)    Glucose, Bld 107 (*)    All other components within normal limits  CBC WITH DIFFERENTIAL/PLATELET  LIPASE, BLOOD  URINALYSIS, COMPLETE (UACMP) WITH MICROSCOPIC  TROPONIN I (HIGH SENSITIVITY)   ____________________________________________  EKG  ED ECG REPORT I, Rudene Re, the attending physician, personally viewed and interpreted this ECG.  Sinus bradycardia, rate of 56, normal intervals, normal axis, no ST elevations or depressions.  Some artifact due to Parkinson's tremor is seen ____________________________________________  RADIOLOGY  I have personally reviewed the images performed during this visit and I agree with the Radiologist's read.   Interpretation by Radiologist:  Dg Abdomen Acute W/chest  Result Date: 08/12/2018 CLINICAL DATA:  Epigastric abdominal pain. EXAM: DG ABDOMEN ACUTE W/ 1V CHEST COMPARISON:  CT 07/29/2018 FINDINGS: The cardiomediastinal contours are normal. Aortic atherosclerosis. The lungs are clear. Moderate retrocardiac hiatal hernia. There is no free intra-abdominal air. No dilated bowel loops to suggest obstruction. Air-filled normal caliber small and large bowel. Minimal retained barium within colonic diverticula. No radiopaque calculi. Pelvic calcifications are phleboliths. There are vascular calcifications. No acute osseous abnormalities are seen. IMPRESSION: 1. Normal bowel gas pattern. No free air. Retained barium within colonic diverticula in the left colon. 2. Moderate hiatal hernia. 3. No acute chest findings. Electronically Signed   By: Keith Rake M.D.   On: 08/12/2018 02:50   US Abdomen  Limited Ruq  Result Date: 08/12/2018 CLINICAL DATA:  Epigastric abdominal pain. EXAM: ULTRASOUND ABDOMEN LIMITED RIGHT UPPER QUADRANT COMPARISON:  CT 07/29/2018 FINDINGS: Gallbladder: Physiologically distended. No gallstones or wall thickening visualized. No sonographic Murphy sign noted by sonographer. Common bile duct: Diameter: 5 mm, normal. Liver: Hyperechoic lesion in the right lobe measures 2.8 x 2.1 x 2.4 cm, consistent with hemangioma in corresponding to findings on recent CT. Within normal limits in parenchymal echogenicity. Portal vein is patent on color Doppler imaging with normal direction of blood flow towards the liver. IMPRESSION: 1. Normal sonographic appearance of gallbladder. No gallstones. No biliary dilatation. 2. Hepatic hemangioma, as seen on recent CT. Electronically Signed   By: Keith Rake M.D.   On: 08/12/2018 02:47      ____________________________________________   PROCEDURES  Procedure(s) performed: None Procedures Critical Care performed:  None ____________________________________________   INITIAL IMPRESSION / ASSESSMENT AND PLAN / ED COURSE   85 y.o. female with a history of diabetes, Parkinson's disease, hypertension, hiatal hernia who presents for evaluation of abdominal pain.  Patient has been suffering with this pain for 3 months which is burning in his her epigastric region worse with eating.  Recently had CT scan 2 weeks ago which showed possible diverticulitis in the sigmoid colon for which she took augmentin. The pain is intermittent and located in the epigastric region, worse post prandially. Clinically does not fit with diverticulitis. CT showing hiatal hernia. Ddx GERD, PUD, pancreatitis, symptomatic hiatal hernia, GB disease. Will repeat labs, give GI cocktail, IV pepcid, and zofran. Will get RUQ [place].   Clinical Course as of Aug 11 317  Mon Aug 12, 2018  0317 Pain improved with a GI cocktail.  Labs showing mild hypokalemia which was  supplemented.  Patient is tolerating p.o.  Ultrasound and KUB showing no acute findings.  Her symptoms are consistent with severe reflux most likely exacerbated by the fact that she is got a hiatal hernia.  She is currently on a PPI.  Will give a prescription for Maalox as needed and Zofran as needed.  Recommended close follow-up with her PCP and her GI doctor.  Discussed my standard return precautions   [CV]    Clinical Course User Index [CV] Alfred Levins [place], MD      As part of my medical decision making, I reviewed the following data within the [place] notes reviewed and incorporated, Labs reviewed , EKG interpreted , Old EKG reviewed, Old chart reviewed, Radiograph reviewed , Notes from prior ED visits and [place] Controlled Substance Database   Patient was evaluated in Emergency Department today for the symptoms described in the history of present illness. Patient was evaluated in the context of the global COVID-19 pandemic, which necessitated consideration that the patient might be at risk for infection  with the SARS-CoV-2 virus that causes COVID-19. Institutional protocols and algorithms that pertain to the evaluation of patients at risk for COVID-19 are in a state of rapid change based on information released by regulatory bodies including the CDC and federal and state organizations. These policies and algorithms were followed during the patient's care in the ED.   ____________________________________________   FINAL CLINICAL IMPRESSION(S) / ED DIAGNOSES   Final diagnoses:  Epigastric abdominal pain  Gastroesophageal reflux disease, esophagitis presence not specified      NEW MEDICATIONS STARTED DURING THIS VISIT:  ED Discharge Orders         Ordered    alum & mag hydroxide-simeth (MAALOX MAX) 742-595-63 MG/5ML suspension  Every 6 hours PRN     08/12/18 0318    ondansetron (ZOFRAN ODT) 4 MG disintegrating tablet  Every 8 hours PRN     08/12/18 0318            Note:  This document was prepared using Dragon voice recognition software and may include unintentional dictation errors.    Alfred Levins, [place], MD 08/12/18 [phone]

## 2018-08-12 NOTE — ED Notes (Signed)
Daughter Wells Guiles) updated on plan of care and pt's status

## 2018-08-12 NOTE — ED Notes (Signed)
Assisted primary RN with repositioning in bed for comfort.

## 2018-08-13 ENCOUNTER — Ambulatory Visit
Admission: RE | Admit: 2018-08-13 | Discharge: 2018-08-13 | Disposition: A | Discharge status: Home / Self Care | Payer: Medicare Other | Source: Ambulatory Visit | Attending: Student | Admitting: Student

## 2018-08-13 ENCOUNTER — Other Ambulatory Visit: Payer: Self-pay

## 2018-08-13 DIAGNOSIS — R1013 Epigastric pain: Secondary | ICD-10-CM | POA: Diagnosis not present

## 2018-08-13 MED ORDER — TECHNETIUM TC 99M MEBROFENIN IV KIT
4.8700 | PACK | Freq: Once | INTRAVENOUS | Status: AC | PRN
  Administered 2018-08-13: 4.87 via INTRAVENOUS

## 2018-08-19 ENCOUNTER — Ambulatory Visit: Payer: Medicare Other | Admitting: Neurology

## 2018-08-22 ENCOUNTER — Ambulatory Visit: Payer: Medicare Other | Admitting: Physical Therapy

## 2018-08-28 ENCOUNTER — Other Ambulatory Visit: Payer: Self-pay

## 2018-08-28 MED ORDER — LINACLOTIDE 145 MCG PO CAPS: 145.0000 ug | ORAL_CAPSULE | Freq: Every day | ORAL | 1 refills | Status: AC

## 2018-08-29 ENCOUNTER — Ambulatory Visit: Payer: Medicare Other | Admitting: Physical Therapy

## 2018-09-05 ENCOUNTER — Ambulatory Visit: Payer: Medicare Other | Admitting: Physical Therapy

## 2018-09-12 ENCOUNTER — Ambulatory Visit: Payer: Medicare Other | Admitting: Physical Therapy

## 2018-09-19 ENCOUNTER — Ambulatory Visit: Payer: Medicare Other | Admitting: Physical Therapy

## 2018-09-26 ENCOUNTER — Ambulatory Visit: Payer: Medicare Other | Admitting: Physical Therapy

## 2018-10-03 ENCOUNTER — Ambulatory Visit: Payer: Medicare Other | Admitting: Physical Therapy

## 2018-10-10 ENCOUNTER — Ambulatory Visit: Payer: Medicare Other | Admitting: Physical Therapy

## 2018-10-17 ENCOUNTER — Ambulatory Visit: Payer: Medicare Other | Admitting: Physical Therapy

## 2018-10-24 ENCOUNTER — Ambulatory Visit: Payer: Medicare Other | Admitting: Neurology

## 2020-02-11 ENCOUNTER — Other Ambulatory Visit: Payer: Self-pay | Admitting: Neurology

## 2020-02-11 DIAGNOSIS — G20 Parkinson's disease: Secondary | ICD-10-CM

## 2020-02-12 NOTE — Telephone Encounter (Signed)
Pt needs an appt for further refills.  Last visit with MD was in 2020. Refills will be given once appt is made.

## 2020-04-04 DIAGNOSIS — I872 Venous insufficiency (chronic) (peripheral): Secondary | ICD-10-CM | POA: Insufficient documentation

## 2020-04-04 DIAGNOSIS — I890 Lymphedema, not elsewhere classified: Secondary | ICD-10-CM | POA: Insufficient documentation

## 2020-04-04 NOTE — Progress Notes (Signed)
MRN : [id number]  Tiffany Ramirez is a 87 y.o. (08/05/1928) female who presents with chief complaint of No chief complaint on file. Marland Kitchen  History of Present Illness:   Patient is seen for evaluation of leg swelling associated with bluish discoloration. The patient first noticed the swelling remotely but is now concerned because of an increase in the overall edema. The swelling is associated with some pain and discoloration. The patient notes that in the morning the legs are significantly improved but they steadily worsened throughout the course of the day. Elevation makes the legs better, dependency makes them much worse.   There is no history of ulcerations associated with the swelling.   The patient denies any recent changes in their medications.  The patient has not been wearing graduated compression.  The patient has no had any past angiography, interventions or vascular surgery.  The patient denies a history of DVT or PE. There is no prior history of phlebitis. There is no history of primary lymphedema.  There is no history of radiation treatment to the groin or pelvis No history of malignancies. No history of trauma or groin or pelvic surgery. No history of foreign travel or parasitic infections area    STAT ABI"s are normal bilaterally with triphasic signals bialterally  No outpatient medications have been marked as taking for the 04/05/20 encounter (Appointment) with Delana Meyer, Dolores Lory, MD.    Past Medical History:  Diagnosis Date  . Anxiety   . Borderline diabetic    08-2014  . Depression   . Diabetes mellitus without complication ([place])   . Insomnia with sleep apnea, unspecified   . Other and unspecified hyperlipidemia   . Parkinson's disease ([place])   . Skin cancer, basal cell   . Sleep disorder   . Unspecified essential hypertension     Past Surgical History:  Procedure Laterality Date  . CATARACT EXTRACTION Bilateral     Social History Social History    Tobacco Use  . Smoking status: Never Smoker  . Smokeless tobacco: Never Used  Substance Use Topics  . Alcohol use: No  . Drug use: No    Family History Family History  Problem Relation Age of Onset  . Heart failure Mother   . Hyperlipidemia Mother   . Heart attack Father   . Diabetes Father   . Brain cancer Sister   . Psychiatric Illness Sister   . Stroke Sister   . Deep vein thrombosis Sister   . Lung cancer Brother   No family history of bleeding/clotting disorders, porphyria or autoimmune disease   Allergies  Allergen Reactions  . Phenobarbital Other (See Comments)    Overall restlessness     REVIEW OF SYSTEMS (Negative unless checked)  Constitutional: [] Weight loss  [] Fever  [] Chills Cardiac: [] Chest pain   [] Chest pressure   [] Palpitations   [] Shortness of breath when laying flat   [] Shortness of breath with exertion. Vascular:  [] Pain in legs with walking   [] Pain in legs at rest  [] History of DVT   [] Phlebitis   [x] Swelling in legs   [] Varicose veins   [] Non-healing ulcers Pulmonary:   [] Uses home oxygen   [] Productive cough   [] Hemoptysis   [] Wheeze  [] COPD   [] Asthma Neurologic:  [] Dizziness   [] Seizures   [] History of stroke   [] History of TIA  [] Aphasia   [] Vissual changes   [] Weakness or numbness in arm   [] Weakness or numbness in leg Musculoskeletal:   [] Joint swelling   []   Joint pain   [] Low back pain Hematologic:  [] Easy bruising  [] Easy bleeding   [] Hypercoagulable state   [] Anemic Gastrointestinal:  [] Diarrhea   [] Vomiting  [] Gastroesophageal reflux/heartburn   [] Difficulty swallowing. Genitourinary:  [] Chronic kidney disease   [] Difficult urination  [] Frequent urination   [] Blood in urine Skin:  [] Rashes   [] Ulcers  Psychological:  [] History of anxiety   []  History of major depression.  Physical Examination  There were no vitals filed for this visit. There is no height or weight on file to calculate BMI. Gen: WD/WN, NAD Head: [place]/AT, No temporalis  wasting.  Ear/Nose/Throat: Hearing grossly intact, nares w/o erythema or drainage, poor dentition Eyes: PER, EOMI, sclera nonicteric.  Neck: Supple, no masses.  No bruit or JVD.  Pulmonary:  Good air movement, clear to auscultation bilaterally, no use of accessory muscles.  Cardiac: RRR, normal S1, S2, no Murmurs. Vascular: scattered varicosities present bilaterally.  Mild venous stasis changes to the legs bilaterally.  2+ soft pitting edema right> left.  Dependent cyanosis is also noted Vessel Right Left  Radial Palpable Palpable  PT Palpable Not Palpable  DP Trace Palpable Not Palpable  Gastrointestinal: soft, non-distended. No guarding/no peritoneal signs.  Musculoskeletal: M/S 5/5 throughout.  No deformity or atrophy.  Neurologic: CN 2-12 intact. Pain and light touch intact in extremities.  Symmetrical.  Speech is fluent. Motor exam as listed above. Psychiatric: Judgment intact, Mood & affect appropriate for pt's clinical situation. Dermatologic: Venous rashes no ulcers noted.  No changes consistent with cellulitis. Lymph : No Cervical lymphadenopathy, no lichenification or skin changes of chronic lymphedema.  CBC Lab Results  Component Value Date   WBC 4.9 08/12/2018   HGB 13.3 08/12/2018   HCT 39.3 08/12/2018   MCV 88.1 08/12/2018   PLT 178 08/12/2018    BMET    Component Value Date/Time   NA 138 08/12/2018 0133   NA 139 03/27/2018 1447   K 2.8 (L) 08/12/2018 0133   CL 98 08/12/2018 0133   CO2 32 08/12/2018 0133   GLUCOSE 107 (H) 08/12/2018 0133   BUN 11 08/12/2018 0133   CREATININE 0.60 08/12/2018 0133   CALCIUM 9.3 08/12/2018 0133   GFRNONAA >60 08/12/2018 0133   GFRAA >60 08/12/2018 0133   CrCl cannot be calculated (Patient's most recent lab result is older than the maximum 21 days allowed.).  COAG No results found for: INR, PROTIME  Radiology No results found.   Assessment/Plan 1. Chronic venous insufficiency No surgery or intervention at this point in  time.    STAT ABI's are normal, mild non-compressiblity.  I have had a long discussion with the patient regarding venous insufficiency and why it  causes symptoms. I have discussed with the patient the chronic skin changes that accompany venous insufficiency and the long term sequela such as infection and ulceration.  Patient will begin wearing graduated compression stockings class 1 (20-30 mmHg) or compression wraps on a daily basis a prescription was given. The patient will put the stockings on first thing in the morning and removing them in the evening. The patient is instructed specifically not to sleep in the stockings.    In addition, behavioral modification including several periods of elevation of the lower extremities during the day will be continued. I have demonstrated that proper elevation is a position with the ankles at heart level.  The patient is instructed to continue exercise as tolerated, especially walking as able on a daily basis  The patient can follow up as  needed to reassess the degree of swelling and the control that graduated compression stockings or compression wraps  is offering.   The patient can be assessed for a Lymph Pump at that time  2. Lymphedema No surgery or intervention at this point in time.  I have reviewed my discussion with the patient regarding venous insufficiency and why it causes symptoms. I have discussed with the patient the chronic skin changes that accompany venous insufficiency and the long term sequela such as ulceration. Patient will contnue wearing graduated compression stockings on a daily basis, as this has provided excellent control of his edema. The patient will put the stockings on first thing in the morning and removing them in the evening. The patient is reminded not to sleep in the stockings.  In addition, behavioral modification including elevation during the day will be initiated. Exercise is strongly encouraged.  Given the patient's  good control and lack of any problems regarding the venous insufficiency and lymphedema a lymph pump in not need at this time.  The patient will follow up with me PRN should anything change.  The patient voices agreement with this plan.   3. Arterial vascular disease Recommend:  I do not find evidence of life style limiting vascular disease. The patient specifically denies life style limitation.  Previous noninvasive studies including ABI's of the legs do not identify critical vascular problems.  The patient should continue walking and begin a more formal exercise program. The patient should continue his antiplatelet therapy and aggressive treatment of the lipid abnormalities.  The patient should begin wearing graduated compression socks 15-20 mmHg strength to control her mild edema.  Patient will follow-up with me on a PRN basis   4. Hyperlipidemia, unspecified hyperlipidemia type Continue statin as ordered and reviewed, no changes at this time   5. Essential hypertension Continue antihypertensive medications as already ordered, these medications have been reviewed and there are no changes at this time.     Hortencia Pilar, MD  04/04/2020 11:56 AM

## 2020-04-05 ENCOUNTER — Ambulatory Visit (INDEPENDENT_AMBULATORY_CARE_PROVIDER_SITE_OTHER): Payer: Medicare Other | Admitting: Vascular Surgery

## 2020-04-05 ENCOUNTER — Other Ambulatory Visit (INDEPENDENT_AMBULATORY_CARE_PROVIDER_SITE_OTHER): Payer: Self-pay | Admitting: Vascular Surgery

## 2020-04-05 ENCOUNTER — Encounter (INDEPENDENT_AMBULATORY_CARE_PROVIDER_SITE_OTHER): Payer: Self-pay | Admitting: Vascular Surgery

## 2020-04-05 ENCOUNTER — Ambulatory Visit (INDEPENDENT_AMBULATORY_CARE_PROVIDER_SITE_OTHER): Payer: Medicare Other

## 2020-04-05 ENCOUNTER — Other Ambulatory Visit: Payer: Self-pay

## 2020-04-05 VITALS — BP 146/72 | HR 64 | Resp 16 | Ht 66.0 in | Wt 117.0 lb

## 2020-04-05 DIAGNOSIS — I890 Lymphedema, not elsewhere classified: Secondary | ICD-10-CM | POA: Diagnosis not present

## 2020-04-05 DIAGNOSIS — I872 Venous insufficiency (chronic) (peripheral): Secondary | ICD-10-CM

## 2020-04-05 DIAGNOSIS — I7090 Unspecified atherosclerosis: Secondary | ICD-10-CM

## 2020-04-05 DIAGNOSIS — I10 Essential (primary) hypertension: Secondary | ICD-10-CM

## 2020-04-05 DIAGNOSIS — L819 Disorder of pigmentation, unspecified: Secondary | ICD-10-CM

## 2020-04-05 DIAGNOSIS — E785 Hyperlipidemia, unspecified: Secondary | ICD-10-CM | POA: Diagnosis not present

## 2020-04-07 ENCOUNTER — Encounter (INDEPENDENT_AMBULATORY_CARE_PROVIDER_SITE_OTHER): Payer: Self-pay | Admitting: Vascular Surgery

## 2020-08-31 ENCOUNTER — Other Ambulatory Visit: Payer: Self-pay | Admitting: Gastroenterology

## 2020-08-31 DIAGNOSIS — R1013 Epigastric pain: Secondary | ICD-10-CM

## 2020-09-03 ENCOUNTER — Other Ambulatory Visit: Payer: Self-pay

## 2020-09-03 ENCOUNTER — Ambulatory Visit
Admission: RE | Admit: 2020-09-03 | Discharge: 2020-09-03 | Disposition: A | Discharge status: Home / Self Care | Payer: Medicare Other | Source: Ambulatory Visit | Attending: Gastroenterology | Admitting: Gastroenterology

## 2020-09-03 DIAGNOSIS — R1013 Epigastric pain: Secondary | ICD-10-CM | POA: Diagnosis present

## 2020-09-10 ENCOUNTER — Emergency Department: Payer: Medicare Other

## 2020-09-10 ENCOUNTER — Other Ambulatory Visit: Payer: Self-pay

## 2020-09-10 ENCOUNTER — Emergency Department
Admission: EM | Admit: 2020-09-10 | Discharge: 2020-09-10 | Disposition: A | Discharge status: Home / Self Care | Payer: Medicare Other | Attending: Emergency Medicine | Admitting: Emergency Medicine

## 2020-09-10 DIAGNOSIS — R55 Syncope and collapse: Secondary | ICD-10-CM | POA: Insufficient documentation

## 2020-09-10 DIAGNOSIS — Z79899 Other long term (current) drug therapy: Secondary | ICD-10-CM | POA: Insufficient documentation

## 2020-09-10 DIAGNOSIS — Z7902 Long term (current) use of antithrombotics/antiplatelets: Secondary | ICD-10-CM | POA: Insufficient documentation

## 2020-09-10 DIAGNOSIS — S51812A Laceration without foreign body of left forearm, initial encounter: Secondary | ICD-10-CM | POA: Insufficient documentation

## 2020-09-10 DIAGNOSIS — Z85828 Personal history of other malignant neoplasm of skin: Secondary | ICD-10-CM | POA: Insufficient documentation

## 2020-09-10 DIAGNOSIS — E785 Hyperlipidemia, unspecified: Secondary | ICD-10-CM | POA: Insufficient documentation

## 2020-09-10 DIAGNOSIS — S61511A Laceration without foreign body of right wrist, initial encounter: Secondary | ICD-10-CM | POA: Diagnosis not present

## 2020-09-10 DIAGNOSIS — M25552 Pain in left hip: Secondary | ICD-10-CM | POA: Diagnosis not present

## 2020-09-10 DIAGNOSIS — T148XXA Other injury of unspecified body region, initial encounter: Secondary | ICD-10-CM

## 2020-09-10 DIAGNOSIS — S81812A Laceration without foreign body, left lower leg, initial encounter: Secondary | ICD-10-CM | POA: Insufficient documentation

## 2020-09-10 DIAGNOSIS — E1169 Type 2 diabetes mellitus with other specified complication: Secondary | ICD-10-CM | POA: Diagnosis not present

## 2020-09-10 DIAGNOSIS — Y92009 Unspecified place in unspecified non-institutional (private) residence as the place of occurrence of the external cause: Secondary | ICD-10-CM | POA: Diagnosis not present

## 2020-09-10 DIAGNOSIS — S59912A Unspecified injury of left forearm, initial encounter: Secondary | ICD-10-CM | POA: Diagnosis present

## 2020-09-10 DIAGNOSIS — I10 Essential (primary) hypertension: Secondary | ICD-10-CM | POA: Diagnosis not present

## 2020-09-10 DIAGNOSIS — W1839XA Other fall on same level, initial encounter: Secondary | ICD-10-CM | POA: Diagnosis not present

## 2020-09-10 DIAGNOSIS — W19XXXA Unspecified fall, initial encounter: Secondary | ICD-10-CM

## 2020-09-10 DIAGNOSIS — G20 Parkinson's disease: Secondary | ICD-10-CM | POA: Diagnosis not present

## 2020-09-10 LAB — BASIC METABOLIC PANEL
Anion gap: 9 (ref 5–15)
BUN: 21 mg/dL (ref 8–23)
CO2: 30 mmol/L (ref 22–32)
Calcium: 8.8 mg/dL — ABNORMAL LOW (ref 8.9–10.3)
Chloride: 96 mmol/L — ABNORMAL LOW (ref 98–111)
Creatinine, Ser: 0.7 mg/dL (ref 0.44–1.00)
GFR, Estimated: >60 mL/min (ref >60)
Glucose, Bld: 92 mg/dL (ref 70–99)
Potassium: 3.4 mmol/L — ABNORMAL LOW (ref 3.5–5.1)
Sodium: 135 mmol/L (ref 135–145)

## 2020-09-10 LAB — TYPE AND SCREEN
ABO/RH(D): O POS
Antibody Screen: NEGATIVE

## 2020-09-10 LAB — CBC
HCT: 31.7 % — ABNORMAL LOW (ref 36.0–46.0)
Hemoglobin: 10.1 g/dL — ABNORMAL LOW (ref 12.0–15.0)
MCH: 25.3 pg — ABNORMAL LOW (ref 26.0–34.0)
MCHC: 31.9 g/dL (ref 30.0–36.0)
MCV: 79.3 fL — ABNORMAL LOW (ref 80.0–100.0)
Platelets: 199 10*3/uL (ref 150–400)
RBC: 4 MIL/uL (ref 3.87–5.11)
RDW: 15.4 % (ref 11.5–15.5)
WBC: 5.2 10*3/uL (ref 4.0–10.5)
nRBC: 0 % (ref 0.0–0.2)

## 2020-09-10 MED ORDER — ACETAMINOPHEN 500 MG PO TABS
1000.0000 mg | ORAL_TABLET | Freq: Once | ORAL | Status: AC
  Administered 2020-09-10: 1000 mg via ORAL
  Filled 2020-09-10: qty 2

## 2020-09-10 MED ORDER — BACITRACIN ZINC 500 UNIT/GM EX OINT
TOPICAL_OINTMENT | Freq: Once | CUTANEOUS | Status: AC
  Filled 2020-09-10: qty 1.8

## 2020-09-10 NOTE — ED Notes (Signed)
Pt's wounds cleansed and dressed with telfa gauze, bacitracin and curlex.

## 2020-09-10 NOTE — ED Notes (Signed)
Wounds cleansed, old bandages removed, prepped for Dr's assessment.

## 2020-09-10 NOTE — ED Notes (Addendum)
Pt. Up to toilet with stand by assist. Gait steady.

## 2020-09-10 NOTE — ED Provider Notes (Signed)
[facility] Emergency Department Provider Note ____________________________________________   Event Date/Time   First MD Initiated Contact with Patient 09/10/20 1911     (approximate)  I have reviewed the triage vital signs and the nursing notes.  HISTORY  Chief Complaint No chief complaint on file.   HPI Tiffany Ramirez is a 87 y.o. femalewho presents to the ED for evaluation of left hip pain after a fall.  Chart review indicates history of Parkinson's disease, HTN, HLD and GERD.  On Plavix.  Chronic venous insufficiency and lymphedema.  Patient presents to the ED for evaluation of a fall that occurred earlier this morning as well as possible associated syncope.  She reports getting up from a sitting position, feeling dizzy and then "falling out."  She reports a syncopal episode and falling onto her left hip and shoulder.  She reports happened at 8 AM this morning.  She reports she was able to get herself back up and has ambulated with her walker, at her baseline, since that time.  Patient reports telling her PCP and her daughter about this, and I urged her to get evaluated in the ED.  Here in the ED, patient reports she feels fine and that her hip feels much better on the left.  She reports that she just wants to go home.   They further report that she was restarted on nitrofurantoin to treat acute cystitis and has been compliant with this medication.  Denies fevers or additional syncopal episodes or falls.  Past Medical History:  Diagnosis Date   Anxiety    Borderline diabetic    08-2014   Depression    Diabetes mellitus without complication ([place])    Insomnia with sleep apnea, unspecified    Other and unspecified hyperlipidemia    Parkinson's disease ([place])    Skin cancer, basal cell    Sleep disorder    Unspecified essential hypertension     Patient Active Problem List   Diagnosis Date Noted   Lymphedema 04/04/2020   Chronic venous  insufficiency 04/04/2020   Atypical chest pain 06/14/2018   Paroxysmal atrial fibrillation ([place]) 06/14/2018   Sinus bradycardia 06/13/2018   Iron malabsorption 05/08/2018   Iron deficiency anemia due to chronic blood loss 05/07/2018   Shortness of breath 05/07/2018   Type 2 diabetes mellitus with peripheral angiopathy ([place]) 08/22/2017   Medicare annual wellness visit, initial 02/15/2016   Swelling of limb 12/28/2015   Chronic hyperglycemia 08/13/2014   Paralysis agitans ([place]) 08/28/2013   Hyperlipidemia, mixed 07/28/2013   Arterial vascular disease 07/28/2013   Insomnia with sleep apnea    Sleep disorder    Essential hypertension     Past Surgical History:  Procedure Laterality Date   CATARACT EXTRACTION Bilateral     Prior to Admission medications   Medication Sig Start Date End Date Taking? Authorizing Provider  alum & mag hydroxide-simeth (MAALOX MAX) 400-400-40 MG/5ML suspension Take 5 mLs by mouth every 6 (six) hours as needed for indigestion. 08/12/18   Rudene Re, MD  carbidopa-levodopa (SINEMET CR) 50-200 MG tablet Take 1 tablet by mouth at bedtime. 02/18/18   Star Age, MD  carbidopa-levodopa (SINEMET IR) 25-100 MG tablet Take 1 tablet by mouth 6 (six) times daily. At 7, 10, 1 PM, 4 PM, 7 PM, and 10 PM. 02/18/18   Star Age, MD  clopidogrel (PLAVIX) 75 MG tablet Take 75 mg by mouth daily.    [provider]  dexlansoprazole (DEXILANT) 60 MG capsule Take  by mouth. 03/16/20 06/14/20  [provider]  famotidine (PEPCID) 40 MG tablet Take 40 mg by mouth daily. 03/21/20   [provider]  gabapentin (NEURONTIN) 300 MG capsule  03/21/20   [provider]  hydrochlorothiazide (HYDRODIURIL) 12.5 MG tablet Take 12.5 mg by mouth daily. 07/18/18   [provider]  linaclotide Rolan Lipa) 145 MCG CAPS capsule Take 1 capsule (145 mcg total) by mouth daily before breakfast. 08/28/18   Star Age, MD  ondansetron (ZOFRAN ODT) 4 MG  disintegrating tablet Take 1 tablet (4 mg total) by mouth every 8 (eight) hours as needed. 08/12/18   Alfred Levins, [place], MD  pantoprazole (PROTONIX) 20 MG tablet Take 40 mg by mouth daily.     [provider]  Probiotic Product (PROBIOTIC PO) Take by mouth.    [provider]  simvastatin (ZOCOR) 20 MG tablet Take 20 mg by mouth daily.     [provider]  sucralfate (CARAFATE) 1 GM/10ML suspension Take by mouth. 07/17/18 07/17/19  [provider]    Allergies Codeine, Phenobarbital, and Other  Family History  Problem Relation Age of Onset   Heart failure Mother    Hyperlipidemia Mother    Heart attack Father    Diabetes Father    Brain cancer Sister    Psychiatric Illness Sister    Stroke Sister    Deep vein thrombosis Sister    Lung cancer Brother     Social History Social History   Tobacco Use   Smoking status: Never   Smokeless tobacco: Never  Substance Use Topics   Alcohol use: No   Drug use: No    Review of Systems  Constitutional: No fever/chills Eyes: No visual changes. ENT: No sore throat. Cardiovascular: Denies chest pain. Respiratory: Denies shortness of breath. Gastrointestinal: No abdominal pain.  No nausea, no vomiting.  No diarrhea.  No constipation. Genitourinary: Negative for dysuria. Musculoskeletal: Negative for back pain. Traumatic left hip and left shoulder pain. Skin: Negative for rash. Neurological: Negative for headaches, focal weakness or numbness.  ____________________________________________   PHYSICAL EXAM:  VITAL SIGNS: Vitals:   09/10/20 2000 09/10/20 2015  BP: (!) 174/63   Pulse:    Resp: (!) 23 20  Temp:    SpO2:        Constitutional: Alert and oriented. Well appearing and in no acute distress. Eyes: Conjunctivae are normal. PERRL. EOMI. Head: Atraumatic. Nose: No congestion/rhinnorhea. Mouth/Throat: Mucous membranes are moist.  Oropharynx non-erythematous. Neck: No stridor. No  cervical spine tenderness to palpation. Cardiovascular: Normal rate, regular rhythm. Grossly normal heart sounds.  Good peripheral circulation. Respiratory: Normal respiratory effort.  No retractions. Lungs CTAB. Gastrointestinal: Soft , nondistended. No CVA tenderness. Mild suprapubic tenderness without peritoneal features.  Abdomen is otherwise benign. Musculoskeletal: No significant shortening or rotation of the affected left hip.  Mild tenderness diffusely to palpation over the left greater trochanter and left hip. Full active and passive ROM of the left shoulder and left hip without evidence of open injury. She has multiple skin tears throughout her extremities.  Primarily a 2 cm diameter circular skin tear to the left proximal forearm laterally.  This is hemostatic without discrete laceration and not amenable to suture repair.  Further has a couple small superficial abrasions and skin tears to her left shin and right wrist.  No impaired range of motion or deformity otherwise to the extremities. Neurologic:  Normal speech and language. No gross focal neurologic deficits are appreciated.  Skin:  Skin is  warm, dry and intact. No rash noted. Psychiatric: Mood and affect are normal. Speech and behavior are normal.  ____________________________________________   LABS (all labs ordered are listed, but only abnormal results are displayed)  Labs Reviewed  BASIC METABOLIC PANEL - Abnormal; Notable for the following components:      Result Value   Potassium 3.4 (*)    Chloride 96 (*)    Calcium 8.8 (*)    All other components within normal limits  CBC - Abnormal; Notable for the following components:   Hemoglobin 10.1 (*)    HCT 31.7 (*)    MCV 79.3 (*)    MCH 25.3 (*)    All other components within normal limits  TYPE AND SCREEN   ____________________________________________  12 Lead EKG  Sinus rhythm the rate of 57 bpm.  Normal axis and intervals.  No evidence of acute ischemia.   Some tremulous artifact to the inferior leads. ____________________________________________  RADIOLOGY  ED MD interpretation: Plain film of the pelvis and left hip reviewed by me without evidence of acute fracture or dislocation. CXR reviewed by me without evidence of acute cardiopulmonary pathology.  Official radiology report(s): DG Chest Portable 1 View  Result Date: 09/10/2020 CLINICAL DATA:  Syncope.  Fall from home. EXAM: PORTABLE CHEST 1 VIEW COMPARISON:  CT 05/14/2018 FINDINGS: Heart size and pulmonary vascularity are normal. Emphysematous changes in the lungs. Peribronchial thickening and bronchiectasis. No airspace disease or consolidation. No pleural effusions. No pneumothorax. Mediastinal contours appear intact. Calcified and tortuous aorta. Moderate-sized esophageal hiatal hernia behind the heart. IMPRESSION: Emphysematous and chronic bronchitic changes in the lungs. No evidence of active pulmonary disease. Electronically Signed   By: Lucienne Capers M.D.   On: 09/10/2020 18:29   DG Hip Unilat With Pelvis 2-3 Views Left  Result Date: 09/10/2020 CLINICAL DATA:  Fall and left hip pain. EXAM: DG HIP (WITH OR WITHOUT PELVIS) 2-3V LEFT COMPARISON:  None. FINDINGS: There is no acute fracture or dislocation. The bones are osteopenic. Mild bilateral hip arthritic changes. Dense stool throughout the distal colon outlining severe diverticulosis. The soft tissues are unremarkable. IMPRESSION: No acute fracture or dislocation. Electronically Signed   By: Anner Crete M.D.   On: 09/10/2020 19:33    ____________________________________________   PROCEDURES and INTERVENTIONS  Procedure(s) performed (including Critical Care):  .1-3 Lead EKG Interpretation  Date/Time: 09/10/2020 9:43 PM Performed by: Vladimir Crofts, MD Authorized by: Vladimir Crofts, MD     Interpretation: normal     ECG rate:  68   ECG rate assessment: normal     Rhythm: sinus rhythm     Ectopy: none     Conduction:  normal    Medications  acetaminophen (TYLENOL) tablet 1,000 mg (1,000 mg Oral Given 09/10/20 2005)  bacitracin ointment ( Topical Given 09/10/20 2008)    ____________________________________________   MDM / ED COURSE   Pleasant 87 year old woman with Parkinson's disease presents after a fall and associated syncopal episode, without evidence of significant acute pathology, and ultimately amenable to outpatient management.  She looks clinically well and has no evidence of neurologic or vascular deficits.  She has some scattered skin tears, documented above, to the extremities but no significant deformity or stigmata of acute fracture to the extremities.  Plain films of the chest and pelvis without evidence of bony pathology.  I discussed with her on multiple occasions my concern for syncope and that she may benefit from observation admission or CT imaging of her head, but she reports that  she feels fine and just wants to go home.  Her daughter is at the bedside and says that she is at her baseline.  She is oriented and has capacity to make this decision.  We provided wound care and discussed return precautions for the ED.  Patient suitable for outpatient management.    ____________________________________________   FINAL CLINICAL IMPRESSION(S) / ED DIAGNOSES  Final diagnoses:  Fall, initial encounter  Syncope and collapse  Multiple skin tears     ED Discharge Orders     None        Merwin Breden   Note:  This document was prepared using Dragon voice recognition software and may include unintentional dictation errors.    Vladimir Crofts, MD 09/10/20 2145

## 2020-09-10 NOTE — Discharge Instructions (Signed)
Use Tylenol for pain and fevers.  Up to 1000 mg per dose, up to 4 times per day.  Do not take more than 4000 mg of Tylenol/acetaminophen within 24 hours..  

## 2020-09-10 NOTE — ED Triage Notes (Signed)
Pt comes via [facility] with c/o syncopal fall from home. Pt states that was at 0800. Pt states severe left hip pain.  152/79 HR-59 20g larm- 4 zofran given. Pt refused pain meds due to unknown allergy.

## 2020-09-11 ENCOUNTER — Observation Stay
Admission: EM | Admit: 2020-09-11 | Discharge: 2020-09-12 | Disposition: A | Discharge status: Home / Self Care | Payer: Medicare Other | Attending: Obstetrics and Gynecology | Admitting: Obstetrics and Gynecology

## 2020-09-11 ENCOUNTER — Observation Stay
Admit: 2020-09-11 | Discharge: 2020-09-11 | Disposition: A | Discharge status: Home / Self Care | Payer: Medicare Other | Attending: Internal Medicine | Admitting: Internal Medicine

## 2020-09-11 ENCOUNTER — Other Ambulatory Visit: Payer: Self-pay

## 2020-09-11 ENCOUNTER — Emergency Department: Payer: Medicare Other

## 2020-09-11 ENCOUNTER — Encounter: Payer: Self-pay | Admitting: Internal Medicine

## 2020-09-11 ENCOUNTER — Observation Stay: Payer: Medicare Other

## 2020-09-11 DIAGNOSIS — E876 Hypokalemia: Secondary | ICD-10-CM | POA: Diagnosis not present

## 2020-09-11 DIAGNOSIS — G20 Parkinson's disease: Secondary | ICD-10-CM | POA: Insufficient documentation

## 2020-09-11 DIAGNOSIS — Z79899 Other long term (current) drug therapy: Secondary | ICD-10-CM | POA: Diagnosis not present

## 2020-09-11 DIAGNOSIS — Z20822 Contact with and (suspected) exposure to covid-19: Secondary | ICD-10-CM | POA: Insufficient documentation

## 2020-09-11 DIAGNOSIS — I10 Essential (primary) hypertension: Secondary | ICD-10-CM | POA: Diagnosis not present

## 2020-09-11 DIAGNOSIS — I480 Paroxysmal atrial fibrillation: Secondary | ICD-10-CM | POA: Diagnosis present

## 2020-09-11 DIAGNOSIS — E860 Dehydration: Secondary | ICD-10-CM | POA: Insufficient documentation

## 2020-09-11 DIAGNOSIS — R55 Syncope and collapse: Secondary | ICD-10-CM | POA: Diagnosis present

## 2020-09-11 DIAGNOSIS — M25552 Pain in left hip: Secondary | ICD-10-CM | POA: Diagnosis not present

## 2020-09-11 DIAGNOSIS — Z85828 Personal history of other malignant neoplasm of skin: Secondary | ICD-10-CM | POA: Diagnosis not present

## 2020-09-11 DIAGNOSIS — E119 Type 2 diabetes mellitus without complications: Secondary | ICD-10-CM | POA: Diagnosis not present

## 2020-09-11 DIAGNOSIS — R531 Weakness: Secondary | ICD-10-CM | POA: Diagnosis not present

## 2020-09-11 LAB — CBC WITH DIFFERENTIAL/PLATELET
Abs Immature Granulocytes: 0.01 10*3/uL (ref 0.00–0.07)
Basophils Absolute: 0 10*3/uL (ref 0.0–0.1)
Basophils Relative: 1 %
Eosinophils Absolute: 0.1 10*3/uL (ref 0.0–0.5)
Eosinophils Relative: 1 %
HCT: 32.5 % — ABNORMAL LOW (ref 36.0–46.0)
Hemoglobin: 10.1 g/dL — ABNORMAL LOW (ref 12.0–15.0)
Immature Granulocytes: 0 %
Lymphocytes Relative: 22 %
Lymphs Abs: 1 10*3/uL (ref 0.7–4.0)
MCH: 24.6 pg — ABNORMAL LOW (ref 26.0–34.0)
MCHC: 31.1 g/dL (ref 30.0–36.0)
MCV: 79.3 fL — ABNORMAL LOW (ref 80.0–100.0)
Monocytes Absolute: 0.5 10*3/uL (ref 0.1–1.0)
Monocytes Relative: 12 %
Neutro Abs: 2.8 10*3/uL (ref 1.7–7.7)
Neutrophils Relative %: 64 %
Platelets: 199 10*3/uL (ref 150–400)
RBC: 4.1 MIL/uL (ref 3.87–5.11)
RDW: 15.4 % (ref 11.5–15.5)
WBC: 4.4 10*3/uL (ref 4.0–10.5)
nRBC: 0 % (ref 0.0–0.2)

## 2020-09-11 LAB — COMPREHENSIVE METABOLIC PANEL
ALT: <5 U/L (ref 0–44)
AST: 15 U/L (ref 15–41)
Albumin: 3.9 g/dL (ref 3.5–5.0)
Alkaline Phosphatase: 70 U/L (ref 38–126)
Anion gap: 9 (ref 5–15)
BUN: 24 mg/dL — ABNORMAL HIGH (ref 8–23)
CO2: 28 mmol/L (ref 22–32)
Calcium: 8.6 mg/dL — ABNORMAL LOW (ref 8.9–10.3)
Chloride: 99 mmol/L (ref 98–111)
Creatinine, Ser: 0.78 mg/dL (ref 0.44–1.00)
GFR, Estimated: >60 mL/min (ref >60)
Glucose, Bld: 73 mg/dL (ref 70–99)
Potassium: 3.2 mmol/L — ABNORMAL LOW (ref 3.5–5.1)
Sodium: 136 mmol/L (ref 135–145)
Total Bilirubin: 1 mg/dL (ref 0.3–1.2)
Total Protein: 6.3 g/dL — ABNORMAL LOW (ref 6.5–8.1)

## 2020-09-11 LAB — RESP PANEL BY RT-PCR (FLU A&B, COVID) ARPGX2
Influenza A by PCR: NEGATIVE
Influenza B by PCR: NEGATIVE
SARS Coronavirus 2 by RT PCR: NEGATIVE

## 2020-09-11 LAB — TROPONIN I (HIGH SENSITIVITY)
Troponin I (High Sensitivity): 16 ng/L (ref <18)
Troponin I (High Sensitivity): 13 ng/L (ref <18)

## 2020-09-11 LAB — ECHOCARDIOGRAM COMPLETE
Area-P 1/2: 1.79 cm2
Height: 66 in
MV VTI: 1.44 cm2
S' Lateral: 2.1 cm
Weight: 1693.13 oz

## 2020-09-11 LAB — TSH: TSH: 2.295 u[IU]/mL (ref 0.350–4.500)

## 2020-09-11 MED ORDER — HYDROCHLOROTHIAZIDE 25 MG PO TABS: 12.5000 mg | ORAL_TABLET | Freq: Every day | ORAL | Status: DC

## 2020-09-11 MED ORDER — CARBIDOPA-LEVODOPA 25-100 MG PO TABS
2.0000 | ORAL_TABLET | Freq: Two times a day (BID) | ORAL | Status: DC
  Administered 2020-09-12: 2 via ORAL
  Filled 2020-09-11 (×2): qty 2

## 2020-09-11 MED ORDER — POTASSIUM CHLORIDE CRYS ER 20 MEQ PO TBCR
40.0000 meq | EXTENDED_RELEASE_TABLET | Freq: Once | ORAL | Status: AC
  Administered 2020-09-11: 40 meq via ORAL
  Filled 2020-09-11: qty 2

## 2020-09-11 MED ORDER — ACETAMINOPHEN 325 MG PO TABS: 650.0000 mg | ORAL_TABLET | ORAL | Status: DC | PRN

## 2020-09-11 MED ORDER — ACETAMINOPHEN 650 MG RE SUPP: 650.0000 mg | RECTAL | Status: DC | PRN

## 2020-09-11 MED ORDER — CARBIDOPA-LEVODOPA 25-100 MG PO TABS
2.5000 | ORAL_TABLET | Freq: Two times a day (BID) | ORAL | Status: DC
  Administered 2020-09-11 – 2020-09-12 (×2): 2.5 via ORAL
  Filled 2020-09-11 (×3): qty 2.5

## 2020-09-11 MED ORDER — LEVODOPA 42 MG IN CAPS: 1.0000 | ORAL_CAPSULE | Freq: Every day | RESPIRATORY_TRACT | Status: DC

## 2020-09-11 MED ORDER — ACETAMINOPHEN 160 MG/5ML PO SOLN
650.0000 mg | ORAL | Status: DC | PRN
  Filled 2020-09-11: qty 20.3

## 2020-09-11 MED ORDER — FLEET ENEMA 7-19 GM/118ML RE ENEM
1.0000 | ENEMA | Freq: Three times a day (TID) | RECTAL | Status: DC | PRN
Start: 2020-09-11 — End: 2020-09-11

## 2020-09-11 MED ORDER — SACCHAROMYCES BOULARDII 250 MG PO CAPS
250.0000 mg | ORAL_CAPSULE | Freq: Every day | ORAL | Status: DC
  Administered 2020-09-11: 250 mg via ORAL
  Filled 2020-09-11 (×2): qty 1

## 2020-09-11 MED ORDER — CARBIDOPA-LEVODOPA 25-100 MG PO TABS
1.0000 | ORAL_TABLET | Freq: Every day | ORAL | Status: DC
  Filled 2020-09-11 (×2): qty 1

## 2020-09-11 MED ORDER — LINACLOTIDE 145 MCG PO CAPS
145.0000 ug | ORAL_CAPSULE | Freq: Every day | ORAL | Status: DC
  Filled 2020-09-11: qty 1

## 2020-09-11 MED ORDER — SODIUM CHLORIDE 0.9 % IV BOLUS
500.0000 mL | Freq: Once | INTRAVENOUS | Status: AC
  Administered 2020-09-11: 500 mL via INTRAVENOUS

## 2020-09-11 MED ORDER — CLOPIDOGREL BISULFATE 75 MG PO TABS: 75.0000 mg | ORAL_TABLET | Freq: Every day | ORAL | Status: DC

## 2020-09-11 MED ORDER — PANTOPRAZOLE SODIUM 40 MG PO TBEC
40.0000 mg | DELAYED_RELEASE_TABLET | Freq: Every day | ORAL | Status: DC
  Administered 2020-09-11: 40 mg via ORAL
  Filled 2020-09-11: qty 1

## 2020-09-11 MED ORDER — GABAPENTIN 300 MG PO CAPS
300.0000 mg | ORAL_CAPSULE | Freq: Every day | ORAL | Status: DC
  Administered 2020-09-11: 300 mg via ORAL
  Filled 2020-09-11: qty 1

## 2020-09-11 MED ORDER — PANTOPRAZOLE SODIUM 40 MG PO TBEC
40.0000 mg | DELAYED_RELEASE_TABLET | Freq: Every day | ORAL | Status: DC
  Administered 2020-09-12: 40 mg via ORAL
  Filled 2020-09-11: qty 1

## 2020-09-11 MED ORDER — CLOPIDOGREL BISULFATE 75 MG PO TABS
75.0000 mg | ORAL_TABLET | Freq: Every day | ORAL | Status: DC
  Administered 2020-09-11: 75 mg via ORAL
  Filled 2020-09-11: qty 1

## 2020-09-11 MED ORDER — SIMVASTATIN 20 MG PO TABS
20.0000 mg | ORAL_TABLET | Freq: Every day | ORAL | Status: DC
  Administered 2020-09-11: 20 mg via ORAL
  Filled 2020-09-11: qty 1

## 2020-09-11 MED ORDER — CARBIDOPA-LEVODOPA 25-100 MG PO TABS
1.0000 | ORAL_TABLET | Freq: Every day | ORAL | Status: DC
  Administered 2020-09-11: 1 via ORAL
  Filled 2020-09-11 (×5): qty 1

## 2020-09-11 MED ORDER — GABAPENTIN 300 MG PO CAPS: 300.0000 mg | ORAL_CAPSULE | Freq: Two times a day (BID) | ORAL | Status: DC

## 2020-09-11 MED ORDER — ENOXAPARIN SODIUM 40 MG/0.4ML IJ SOSY: 40.0000 mg | PREFILLED_SYRINGE | INTRAMUSCULAR | Status: DC

## 2020-09-11 MED ORDER — ENOXAPARIN SODIUM 40 MG/0.4ML IJ SOSY
40.0000 mg | PREFILLED_SYRINGE | INTRAMUSCULAR | Status: DC
  Administered 2020-09-11: 40 mg via SUBCUTANEOUS
  Filled 2020-09-11: qty 0.4

## 2020-09-11 MED ORDER — FLEET ENEMA 7-19 GM/118ML RE ENEM: 1.0000 | ENEMA | Freq: Every day | RECTAL | Status: DC | PRN

## 2020-09-11 MED ORDER — POLYETHYLENE GLYCOL 3350 17 G PO PACK: 17.0000 g | PACK | Freq: Three times a day (TID) | ORAL | Status: DC | PRN

## 2020-09-11 MED ORDER — LINACLOTIDE 145 MCG PO CAPS: 145.0000 ug | ORAL_CAPSULE | Freq: Every day | ORAL | Status: DC

## 2020-09-11 MED ORDER — FLEET ENEMA 7-19 GM/118ML RE ENEM
1.0000 | ENEMA | Freq: Once | RECTAL | Status: AC
  Administered 2020-09-11: 1 via RECTAL

## 2020-09-11 MED ORDER — SODIUM CHLORIDE 0.9% FLUSH
3.0000 mL | Freq: Two times a day (BID) | INTRAVENOUS | Status: DC
  Administered 2020-09-11 – 2020-09-12 (×3): 3 mL via INTRAVENOUS

## 2020-09-11 MED ORDER — CARBIDOPA-LEVODOPA ER 50-200 MG PO TBCR
1.0000 | EXTENDED_RELEASE_TABLET | Freq: Every day | ORAL | Status: DC
  Administered 2020-09-11: 1 via ORAL
  Filled 2020-09-11 (×3): qty 1

## 2020-09-11 MED ORDER — STROKE: EARLY STAGES OF RECOVERY BOOK: Freq: Once | Status: DC

## 2020-09-11 MED ORDER — POTASSIUM CHLORIDE ER 10 MEQ PO TBCR
10.0000 meq | EXTENDED_RELEASE_TABLET | Freq: Every day | ORAL | Status: DC
  Administered 2020-09-11 – 2020-09-12 (×2): 10 meq via ORAL
  Filled 2020-09-11 (×3): qty 1

## 2020-09-11 MED ORDER — SIMVASTATIN 10 MG PO TABS: 20.0000 mg | ORAL_TABLET | Freq: Every day | ORAL | Status: DC

## 2020-09-11 MED ORDER — FLEET ENEMA 7-19 GM/118ML RE ENEM: 1.0000 | ENEMA | Freq: Once | RECTAL | Status: DC

## 2020-09-11 NOTE — Progress Notes (Signed)
*  PRELIMINARY RESULTS* Echocardiogram 2D Echocardiogram has been performed.  Claretta Fraise 09/11/2020, 12:06 PM

## 2020-09-11 NOTE — Progress Notes (Signed)
OT Cancellation Note  Patient Details Name: Tiffany Ramirez MRN: [id number] DOB: 18-Jan-1929   Cancelled Treatment:    Reason Eval/Treat Not Completed: Medical issues which prohibited therapy Pt with runs of tachycardia at rest noted during PT's attempted session this date and documented in nursing notes as well. Will hold OT Evaluation at this time and f/u at later date/time as needed. Thank you.  Gerrianne Scale, [place], OTR/L ascom [phone] 09/11/20, 3:20 PM

## 2020-09-11 NOTE — Plan of Care (Signed)
New admit VS stable, the daughter at bedside, patient C/O constipation and anxious wanted Fleet Enema. The daughter stated the patient gets fleet enema three times a day after each meal. MD aware and ordered fleet enema. Unable to give the patient fleet enema. Stated unable to stand up and go to use BSC. Stated the night shift will administer the enema. Also patient stated would like to take Miralax  with  warm tea in morning. She does not  like orange juice or cold drinks. The daughter also is asking the patient supposed to get IV fluid because in ED they said she was dehydrated.  The MD was made aware.    Problem: Education: Goal: Knowledge of General Education information will improve Description: Including pain rating scale, medication(s)/side effects and non-pharmacologic comfort measures Outcome: Progressing   Problem: Health Behavior/Discharge Planning: Goal: Ability to manage health-related needs will improve Outcome: Progressing   Problem: Clinical Measurements: Goal: Ability to maintain clinical measurements within normal limits will improve Outcome: Progressing Goal: Will remain free from infection Outcome: Progressing Goal: Diagnostic test results will improve Outcome: Progressing Goal: Respiratory complications will improve Outcome: Progressing Goal: Cardiovascular complication will be avoided Outcome: Progressing   Problem: Activity: Goal: Risk for activity intolerance will decrease Outcome: Progressing   Problem: Nutrition: Goal: Adequate nutrition will be maintained Outcome: Progressing   Problem: Coping: Goal: Level of anxiety will decrease Outcome: Progressing   Problem: Elimination: Goal: Will not experience complications related to bowel motility Outcome: Progressing Goal: Will not experience complications related to urinary retention Outcome: Progressing   Problem: Pain Managment: Goal: General experience of comfort will improve Outcome: Progressing    Problem: Safety: Goal: Ability to remain free from injury will improve Outcome: Progressing   Problem: Skin Integrity: Goal: Risk for impaired skin integrity will decrease Outcome: Progressing   Problem: Education: Goal: Knowledge of General Education information will improve Description: Including pain rating scale, medication(s)/side effects and non-pharmacologic comfort measures Outcome: Progressing   Problem: Health Behavior/Discharge Planning: Goal: Ability to manage health-related needs will improve Outcome: Progressing   Problem: Clinical Measurements: Goal: Ability to maintain clinical measurements within normal limits will improve Outcome: Progressing Goal: Will remain free from infection Outcome: Progressing Goal: Diagnostic test results will improve Outcome: Progressing Goal: Respiratory complications will improve Outcome: Progressing Goal: Cardiovascular complication will be avoided Outcome: Progressing   Problem: Activity: Goal: Risk for activity intolerance will decrease Outcome: Progressing   Problem: Nutrition: Goal: Adequate nutrition will be maintained Outcome: Progressing   Problem: Coping: Goal: Level of anxiety will decrease Outcome: Progressing   Problem: Elimination: Goal: Will not experience complications related to bowel motility Outcome: Progressing Goal: Will not experience complications related to urinary retention Outcome: Progressing   Problem: Pain Managment: Goal: General experience of comfort will improve Outcome: Progressing   Problem: Safety: Goal: Ability to remain free from injury will improve Outcome: Progressing   Problem: Skin Integrity: Goal: Risk for impaired skin integrity will decrease Outcome: Progressing

## 2020-09-11 NOTE — ED Notes (Signed)
MD Agbata notified of HR jumping into 140's periodically.

## 2020-09-11 NOTE — ED Notes (Signed)
Patient takes home medications at specific times due to absorption. States she cannot take medications at this time.

## 2020-09-11 NOTE — ED Notes (Signed)
Patient transported to MRI 

## 2020-09-11 NOTE — Progress Notes (Signed)
Patient tolerated half a strawberry ensure.

## 2020-09-11 NOTE — ED Notes (Signed)
Pt assisted to bedside commode. Pt c/o constipation- requested fleet enema.

## 2020-09-11 NOTE — Consult Note (Signed)
[facility] Cardiology  CARDIOLOGY CONSULT NOTE  Patient ID: Tiffany Ramirez MRN: [id number] DOB/AGE: 1928-04-02 87 y.o.  Admit date: 09/11/2020 Referring Physician [place] Primary Physician [facility] Primary Cardiologist Khyri Hinzman Reason for Consultation syncope  HPI: 87 year old female referred for evaluation of syncope.  Patient has a history of Parkinson's disease, essential hypertension and paroxysmal atrial fibrillation.  She was in her usual state of health until 09/10/2020 when she got up from a sitting position, grabbed her walker, fell to the ground.  The patient felt she had brief loss of consciousness lasted a few seconds.  She experienced left hip pain, was evaluated at [facility] ED, where x-ray was negative for fracture.  The patient declined admission, went home, difficulty ambulating to the bathroom family brought her back to the emergency room.  High sensitive troponin was normal (13, 16).  ECG revealed sinus bradycardia at 54 bpm.  Head CT was unremarkable.  Carotid ultrasound revealed less than 50% stenosis bilaterally.  Brain MRI is pending.  Prior 2D echocardiogram 06/06/2018 revealed normal left ventricular function, with LVEF greater than 55%.  The patient denies exertional chest pain or shortness of breath.  She denies palpitations or heart racing.  She denies lightheadedness or dizziness.  Review of systems complete and found to be negative unless listed above     Past Medical History:  Diagnosis Date   Anxiety    Borderline diabetic    08-2014   Depression    Diabetes mellitus without complication (HCC)    Insomnia with sleep apnea, unspecified    Other and unspecified hyperlipidemia    Parkinson's disease (HCC)    Skin cancer, basal cell    Sleep disorder    Unspecified essential hypertension     Past Surgical History:  Procedure Laterality Date   CATARACT EXTRACTION Bilateral     (Not in a hospital admission)  Social History   Socioeconomic History   Marital status:  Married    Spouse name: Joe   Number of children: 2   Years of education: College   Highest education level: Not on file  Occupational History   Occupation: Retired    Comment: Materials engineer  Tobacco Use   Smoking status: Never   Smokeless tobacco: Never  Substance and Sexual Activity   Alcohol use: No   Drug use: No   Sexual activity: Not on file  Other Topics Concern   Not on file  Social History Narrative   Patient lives at home with her husband [name]. Patient is a homemaker.   Right handed.   One cup of coffee daily.   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    Social Determinants of Health   Financial Resource Strain: Not on file  Food Insecurity: Not on file  Transportation Needs: Not on file  Physical Activity: Not on file  Stress: Not on file  Social Connections: Not on file  Intimate Partner Violence: Not on file    Family History  Problem Relation Age of Onset   Heart failure Mother    Hyperlipidemia Mother    Heart attack Father    Diabetes Father    Brain cancer Sister    Psychiatric Illness Sister    Stroke Sister    Deep vein thrombosis Sister    Lung cancer Brother       Review of systems complete and found to be negative unless listed above  PHYSICAL EXAM  General: Well developed, well nourished, in no acute distress HEENT:  Normocephalic and atramatic Neck:  No JVD.  Lungs: Clear bilaterally to auscultation and percussion. Heart: HRRR . Normal S1 and S2 without gallops or murmurs.  Abdomen: Bowel sounds are positive, abdomen soft and non-tender  Msk:  Back normal, normal gait. Normal strength and tone for age. Extremities: No clubbing, cyanosis or edema.   Neuro: Alert and oriented X 3. Psych:  Good affect, responds appropriately  Labs:   Lab Results  Component Value Date   WBC 4.4 09/11/2020   HGB 10.1 (L) 09/11/2020    HCT 32.5 (L) 09/11/2020   MCV 79.3 (L) 09/11/2020   PLT 199 09/11/2020    Recent Labs  Lab 09/11/20 0331  NA 136  K 3.2*  CL 99  CO2 28  BUN 24*  CREATININE 0.78  CALCIUM 8.6*  PROT 6.3*  BILITOT 1.0  ALKPHOS 70  ALT <5  AST 15  GLUCOSE 73   No results found for: CKTOTAL, CKMB, CKMBINDEX, TROPONINI No results found for: CHOL No results found for: HDL No results found for: LDLCALC No results found for: TRIG No results found for: CHOLHDL No results found for: LDLDIRECT    Radiology: DG Lumbar Spine Complete  Result Date: 09/11/2020 CLINICAL DATA:  Fall and back pain. EXAM: LUMBAR SPINE - COMPLETE 4+ VIEW COMPARISON:  CT abdomen pelvis dated 07/29/2018 FINDINGS: Five lumbar type vertebra. There is no acute fracture or subluxation of the lumbar spine. There is osteopenia with multilevel degenerative changes there is advanced atherosclerotic calcification of the aorta. Contrast mixed with stool noted in the colon and outlining severe sigmoid diverticulosis. IMPRESSION: 1. No acute/traumatic lumbar spine pathology. 2. Sigmoid diverticulosis. Electronically Signed   By: Anner Crete M.D.   On: 09/11/2020 03:39   CT Head Wo Contrast  Result Date: 09/11/2020 CLINICAL DATA:  Simple syncope with abnormal neuro exam.  Weakness. EXAM: CT HEAD WITHOUT CONTRAST TECHNIQUE: Contiguous axial images were obtained from the base of the skull through the vertex without intravenous contrast. COMPARISON:  None available FINDINGS: Brain: No evidence of acute infarction, hemorrhage, hydrocephalus, extra-axial collection or mass lesion/mass effect. Well preserved brain volume for age. Chronic small vessel ischemia in the hemispheric white matter. Vascular: No hyperdense vessel or unexpected calcification. 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 Sinuses/Orbits: Negative IMPRESSION: No acute or reversible finding. Electronically Signed   By: Monte Fantasia M.D.   On: 09/11/2020  04:15   US Carotid Bilateral  Result Date: 09/11/2020 CLINICAL DATA:  Syncope EXAM: BILATERAL CAROTID DUPLEX ULTRASOUND TECHNIQUE: Pearline Cables scale imaging, color Doppler and duplex ultrasound were performed of bilateral carotid and vertebral arteries in the neck. COMPARISON:  None. FINDINGS: Criteria: Quantification of carotid stenosis is based on velocity parameters that correlate the residual internal carotid diameter with NASCET-based stenosis levels, using the diameter of the distal internal carotid lumen as the denominator for stenosis measurement. The following velocity measurements were obtained: RIGHT ICA: 71/20 cm/sec CCA: 99991111 cm/sec SYSTOLIC ICA/CCA RATIO:  1.2 ECA:  67 cm/sec LEFT ICA: 77/23 cm/sec CCA: 0000000 cm/sec SYSTOLIC ICA/CCA RATIO:  1.2 ECA:  110 cm/sec RIGHT CAROTID ARTERY: Trace smooth heterogeneous atherosclerotic plaque in the proximal internal carotid artery. By peak systolic velocity criteria, the estimated stenosis is less than 50%. RIGHT VERTEBRAL ARTERY:  Patent with normal antegrade flow. LEFT CAROTID ARTERY: Trace smooth heterogeneous atherosclerotic plaque in the proximal internal carotid artery. By peak systolic velocity criteria,  the estimated stenosis is less than 50%. LEFT VERTEBRAL ARTERY:  Patent with normal antegrade flow. IMPRESSION: 1. Mild (1-49%) stenosis proximal right internal carotid artery secondary to trace smooth heterogeneous atherosclerotic plaque. 2. Mild (1-49%) stenosis proximal left internal carotid artery secondary to trace smooth heterogeneous atherosclerotic plaque. 3. Both vertebral arteries are patent with normal antegrade flow. Signed, Criselda Peaches, MD, [facility] Radiology Electronically Signed   By: Jacqulynn Cadet M.D.   On: 09/11/2020 07:18   DG Chest Portable 1 View  Result Date: 09/10/2020 CLINICAL DATA:  Syncope.  Fall from home. EXAM: PORTABLE CHEST 1 VIEW COMPARISON:  CT  05/14/2018 FINDINGS: Heart size and pulmonary vascularity are normal. Emphysematous changes in the lungs. Peribronchial thickening and bronchiectasis. No airspace disease or consolidation. No pleural effusions. No pneumothorax. Mediastinal contours appear intact. Calcified and tortuous aorta. Moderate-sized esophageal hiatal hernia behind the heart. IMPRESSION: Emphysematous and chronic bronchitic changes in the lungs. No evidence of active pulmonary disease. Electronically Signed   By: Lucienne Capers M.D.   On: 09/10/2020 18:29   DG UGI W DOUBLE CM (HD BA)  Result Date: 09/03/2020 CLINICAL DATA:  Epigastric pain after eating. EXAM: UPPER GI SERIES WITHOUT KUB TECHNIQUE: Routine upper GI series was performed with thin/high density/water soluble barium. FLUOROSCOPY TIME:  Fluoroscopy Time:  0.9 minute Radiation Exposure Index (if provided by the fluoroscopic device): 5 mGy Number of Acquired Spot Images: 0 COMPARISON:  None. FINDINGS: UPPER GI SERIES: Examination of the esophagus demonstrated normal esophageal motility. Moderate volume tracheal aspiration in with spontaneous cough reflex. Normal esophageal morphology without evidence of esophagitis or ulceration. No esophageal stricture, diverticula, or mass lesion. Moderate-sized hiatal hernia. Moderate severe gastroesophageal reflux extending into the upper-mid esophagus. Examination of the stomach demonstrated normal rugal folds and areae gastricae. Gastric mucosa appeared unremarkable without evidence of ulceration, scarring, or mass lesion. Gastric motility and emptying was normal. Fluoroscopic examination of the duodenum demonstrates normal motility and morphology without evidence of ulceration or mass lesion. IMPRESSION: 1. Moderate volume tracheal aspiration in with spontaneous cough reflex. Recommend further evaluation with speech pathology. 2. Moderate-sized hiatal hernia. Moderate severe gastroesophageal reflux extending into the upper-mid esophagus.  Electronically Signed   By: Kathreen Devoid   On: 09/03/2020 10:09   DG Hip Unilat With Pelvis 2-3 Views Left  Result Date: 09/10/2020 CLINICAL DATA:  Fall and left hip pain. EXAM: DG HIP (WITH OR WITHOUT PELVIS) 2-3V LEFT COMPARISON:  None. FINDINGS: There is no acute fracture or dislocation. The bones are osteopenic. Mild bilateral hip arthritic changes. Dense stool throughout the distal colon outlining severe diverticulosis. The soft tissues are unremarkable. IMPRESSION: No acute fracture or dislocation. Electronically Signed   By: Anner Crete M.D.   On: 09/10/2020 19:33    EKG: Sinus bradycardia at 54 bpm  ASSESSMENT AND PLAN:   1.  Syncope, with possible brief loss of consciousness, and patient with history of paroxysmal atrial fibrillation, ECG revealing sinus bradycardia at 54 bpm, with history of Parkinson's.  Head CT unremarkable, carotid ultrasound reveals less than 50% stenosis bilaterally, brain MRI pending.  Etiology likely due to Parkinson's. 2.  Sinus bradycardia, currently hemodynamically stable 3.  Paroxysmal atrial fibrillation, not on chronic anticoagulation due to advanced age, frailty, prone to falling due to Parkinson's 4.  Parkinson's 5.  Essential hypertension, systolic blood pressure mildly elevated, on low-dose hydrochlorothiazide  Recommendations  1.  Agree with current therapy 2.  Continue to monitor for now 3.  Review 2D echocardiogram 4.  Await brain MRI results 5.  Likely require 30-day Holter monitor after discharge  Signed: Isaias Cowman MD,PhD, [facility] 09/11/2020, 11:10 AM

## 2020-09-11 NOTE — Progress Notes (Signed)
SLP Cancellation Note  Patient Details Name: ELISAVET WEATHERSBY MRN: [id number] DOB: [birth year]   Cancelled treatment:        Cognitive-linguistic orders received. Will follow up once pt on the floor for assessment, likely Monday. Per chart review pt passed [place] and is on regular diet.   Deneise Lever, [place], [place] E Padraic Marinos 09/11/2020, 12:33 PM

## 2020-09-11 NOTE — Progress Notes (Signed)
PT Cancellation Note  Patient Details Name: Tiffany Ramirez MRN: [id number] DOB: [birth year]   Cancelled Treatment:    Reason Eval/Treat Not Completed: Patient not medically ready. PT orders received, chart reviewed. Pt received in bed with daughter present & pt providing PLOF & home set up information with daughter assisting. During conversation pt with runs of tachycardia with HR increasing from 70s to max of 242 bpm, then returning to 70-90s, but again increasing to 207 bpm. Pt without c/o pain & nurse notified. Will notify [place] & hold PT evaluation at this time. Will f/u as able & as pt is more appropriate for PT intervention.  Lavone Nian, PT, DPT 09/11/20, 2:14 PM    [name] 09/11/2020, 2:13 PM

## 2020-09-11 NOTE — ED Notes (Signed)
Report received- primary RN endorses med schedule to be changed by pharmacy.

## 2020-09-11 NOTE — H&P (Addendum)
History and Physical    Tiffany Ramirez [id number] DOB: [birth year] DOA: 09/11/2020  PCP: Rusty Aus, MD   Patient coming from: Home  I have personally briefly reviewed patient's old medical records in [place]  Chief Complaint: Weakness  HPI: Tiffany Ramirez is a 87 y.o. female with medical history significant for atrial fibrillation, Parkinson's disease, depression, hypertension who was initially seen in the ER 1 day prior to admission after she had what sounds like a syncopal episode. Patient states that she had gotten up from a sitting position but then she got up too fast, she does not remember feeling dizzy or lightheaded.  She remembers grabbing her walker and the next thing she knows she is on the ground.  She landed on her left hip and shoulder and is unsure if she lost consciousness. She contacted her primary care provider who advised to go to the ER for evaluation due to left hip pain. She was seen in the ER and imaging was negative for any fracture.  Hospitalization was recommended by the ER provider for syncope evaluation which she declined and went home. Her daughter states that 2 hours after he got home patient wanted to use the bathroom and was unable to ambulate to the bathroom.  She states that her legs felt like lead and she could not move them.  This concerned her family and they brought her back to the emergency room for further evaluation. Patient denies having any back pain, no urinary or fecal incontinence or saddle anesthesia. She denies having any chest pain, no shortness of breath, no nausea, no vomiting, no dizziness, no lightheadedness, no headache, no cough, no fever, no chills, no urinary frequency, no nocturia, no dysuria, no palpitations or diaphoresis. Labs show sodium 136, potassium 3.2, chloride 99, bicarb 28, glucose 73, BUN 24, creatinine 0.78, calcium 8.6, alkaline phosphatase 70, albumin 3.9, AST 15, ALT less than 5, total protein 6.3,  troponin 13, white count 4.4, hemoglobin 10.1, hematocrit 32.5, MCV 79.3, RDW 15.4, platelet count 199 Respiratory viral panel is negative Chest x-ray reviewed by me show emphysematous and chronic bronchitic changes in the lungs.  No evidence of acute pulmonary disease. Left hip x-ray no acute fracture or dislocation Lumbar spine CT no acute/traumatic lumbar spine pathology.  Sigmoid diverticulosis CT scan of the head without contrast shows no acute or reversible findings. Carotid ultrasound shows mild stenosis proximal right ICA and proximal left ICA.  Both vertebral arteries are patent with normal antegrade flow. Twelve-lead EKG reviewed by me shows sinus rhythm.    ED Course: Patient is a 87 year old female who presents to the emergency room twice in the last 24 hours initially for evaluation of left hip pain following a fall and then difficulty with ambulation. Patient states that she had a syncopal episode at home leading to the fall.   She will be referred to observation status for further evaluation.   Review of Systems: As per HPI otherwise all other systems reviewed and negative.    Past Medical History:  Diagnosis Date   Anxiety    Borderline diabetic    08-2014   Depression    Diabetes mellitus without complication ([place])    Insomnia with sleep apnea, unspecified    Other and unspecified hyperlipidemia    Parkinson's disease ([place])    Skin cancer, basal cell    Sleep disorder    Unspecified essential hypertension     Past Surgical History:  Procedure Laterality Date  CATARACT EXTRACTION Bilateral      reports that she has never smoked. She has never used smokeless tobacco. She reports that she does not drink alcohol and does not use drugs.  Allergies  Allergen Reactions   Codeine     Other reaction(s): Other (See Comments) Sick on her stomach   Phenobarbital Other (See Comments)    Overall restlessness   Other Anxiety    Benadryal    Family History   Problem Relation Age of Onset   Heart failure Mother    Hyperlipidemia Mother    Heart attack Father    Diabetes Father    Brain cancer Sister    Psychiatric Illness Sister    Stroke Sister    Deep vein thrombosis Sister    Lung cancer Brother       Prior to Admission medications   Medication Sig Start Date End Date Taking? Authorizing Provider  alum & mag hydroxide-simeth (MAALOX MAX) 400-400-40 MG/5ML suspension Take 5 mLs by mouth every 6 (six) hours as needed for indigestion. 08/12/18   Rudene Re, MD  carbidopa-levodopa (SINEMET CR) 50-200 MG tablet Take 1 tablet by mouth at bedtime. 02/18/18   Star Age, MD  carbidopa-levodopa (SINEMET IR) 25-100 MG tablet Take 1 tablet by mouth 6 (six) times daily. At 7, 10, 1 PM, 4 PM, 7 PM, and 10 PM. 02/18/18   Star Age, MD  clopidogrel (PLAVIX) 75 MG tablet Take 75 mg by mouth daily.    [provider]  dexlansoprazole (DEXILANT) 60 MG capsule Take by mouth. 03/16/20 06/14/20  [provider]  famotidine (PEPCID) 40 MG tablet Take 40 mg by mouth daily. 03/21/20   [provider]  gabapentin (NEURONTIN) 300 MG capsule  03/21/20   [provider]  hydrochlorothiazide (HYDRODIURIL) 12.5 MG tablet Take 12.5 mg by mouth daily. 07/18/18   [provider]  linaclotide Rolan Lipa) 145 MCG CAPS capsule Take 1 capsule (145 mcg total) by mouth daily before breakfast. 08/28/18   Star Age, MD  ondansetron (ZOFRAN ODT) 4 MG disintegrating tablet Take 1 tablet (4 mg total) by mouth every 8 (eight) hours as needed. 08/12/18   Alfred Levins, [place], MD  pantoprazole (PROTONIX) 20 MG tablet Take 40 mg by mouth daily.     [provider]  Probiotic Product (PROBIOTIC PO) Take by mouth.    [provider]  simvastatin (ZOCOR) 20 MG tablet Take 20 mg by mouth daily.     [provider]  sucralfate (CARAFATE) 1 GM/10ML suspension Take by mouth. 07/17/18 07/17/19  [provider]     Physical Exam: Vitals:   09/11/20 0800 09/11/20 0831 09/11/20 0900 09/11/20 0930  BP:  (!) 144/69 (!) 152/75 138/80  Pulse: 78 68 68 (!) 59  Resp: '15 19 20 20  '$ Temp:      TempSrc:      SpO2: 99% 96% 99% 100%  Weight:      Height:         Vitals:   09/11/20 0800 09/11/20 0831 09/11/20 0900 09/11/20 0930  BP:  (!) 144/69 (!) 152/75 138/80  Pulse: 78 68 68 (!) 59  Resp: '15 19 20 20  '$ Temp:      TempSrc:      SpO2: 99% 96% 99% 100%  Weight:      Height:          Constitutional: Alert and oriented x 3 . Not in any apparent distress.  Thin and frail HEENT:  Head: Normocephalic and atraumatic.         Eyes: PERLA, EOMI, Conjunctivae are normal. Sclera is non-icteric.       Mouth/Throat: Mucous membranes are moist.       Neck: Supple with no signs of meningismus. Cardiovascular: Regular rate and rhythm. No murmurs, gallops, or rubs. 2+ symmetrical distal pulses are present . No JVD. No LE edema Respiratory: Respiratory effort normal .Lungs sounds clear bilaterally. No wheezes, crackles, or rhonchi.  Gastrointestinal: Soft, non tender, and non distended with positive bowel sounds.  Genitourinary: No CVA tenderness. Musculoskeletal: Nontender with normal range of motion in all extremities. No cyanosis, or erythema of extremities.  Tremors involving her upper extremities Neurologic:  Face is symmetric. Moving all extremities. No gross focal neurologic deficits. Able to move all extremities. Skin: Skin is warm, dry.  No rash or ulcers Psychiatric: Mood and affect are normal    Labs on Admission: I have personally reviewed following labs and imaging studies  CBC: Recent Labs  Lab 09/10/20 1648 09/11/20 0331  WBC 5.2 4.4  NEUTROABS  --  2.8  HGB 10.1* 10.1*  HCT 31.7* 32.5*  MCV 79.3* 79.3*  PLT 199 [id number]   Basic Metabolic Panel: Recent Labs  Lab 09/10/20 1648 09/11/20 0331  NA 135 136  K 3.4* 3.2*  CL 96* 99  CO2 30 28  GLUCOSE 92 73  BUN 21 24*   CREATININE 0.70 0.78  CALCIUM 8.8* 8.6*   GFR: Estimated Creatinine Clearance: 35.4 mL/min (by C-G formula based on SCr of 0.78 mg/dL). Liver Function Tests: Recent Labs  Lab 09/11/20 0331  AST 15  ALT <5  ALKPHOS 70  BILITOT 1.0  PROT 6.3*  ALBUMIN 3.9   No results for input(s): LIPASE, AMYLASE in the last 168 hours. No results for input(s): AMMONIA in the last 168 hours. Coagulation Profile: No results for input(s): INR, PROTIME in the last 168 hours. Cardiac Enzymes: No results for input(s): CKTOTAL, CKMB, CKMBINDEX, TROPONINI in the last 168 hours. BNP (last 3 results) No results for input(s): PROBNP in the last 8760 hours. HbA1C: No results for input(s): HGBA1C in the last 72 hours. CBG: No results for input(s): GLUCAP in the last 168 hours. Lipid Profile: No results for input(s): CHOL, HDL, LDLCALC, TRIG, CHOLHDL, LDLDIRECT in the last 72 hours. Thyroid Function Tests: No results for input(s): TSH, T4TOTAL, FREET4, T3FREE, THYROIDAB in the last 72 hours. Anemia Panel: No results for input(s): VITAMINB12, FOLATE, FERRITIN, TIBC, IRON, RETICCTPCT in the last 72 hours. Urine analysis:    Component Value Date/Time   COLORURINE YELLOW (A) 05/08/2018 1134   APPEARANCEUR CLEAR (A) 05/08/2018 1134   APPEARANCEUR Clear 03/05/2018 1438   LABSPEC 1.015 05/08/2018 1134   PHURINE 5.0 05/08/2018 1134   GLUCOSEU NEGATIVE 05/08/2018 1134   HGBUR SMALL (A) 05/08/2018 1134   BILIRUBINUR NEGATIVE 05/08/2018 1134   BILIRUBINUR Negative 03/05/2018 1438   KETONESUR 5 (A) 05/08/2018 1134   PROTEINUR NEGATIVE 05/08/2018 1134   NITRITE NEGATIVE 05/08/2018 1134   LEUKOCYTESUR SMALL (A) 05/08/2018 1134    Radiological Exams on Admission: DG Lumbar Spine Complete  Result Date: 09/11/2020 CLINICAL DATA:  Fall and back pain. EXAM: LUMBAR SPINE - COMPLETE 4+ VIEW COMPARISON:  CT abdomen pelvis dated 07/29/2018 FINDINGS: Five lumbar type vertebra. There is no acute fracture or  subluxation of the lumbar spine. There is osteopenia with multilevel degenerative changes there is advanced atherosclerotic calcification of the aorta. Contrast mixed with stool noted in the colon and  outlining severe sigmoid diverticulosis. IMPRESSION: 1. No acute/traumatic lumbar spine pathology. 2. Sigmoid diverticulosis. Electronically Signed   By: Anner Crete M.D.   On: 09/11/2020 03:39   CT Head Wo Contrast  Result Date: 09/11/2020 CLINICAL DATA:  Simple syncope with abnormal neuro exam.  Weakness. EXAM: CT HEAD WITHOUT CONTRAST TECHNIQUE: Contiguous axial images were obtained from the base of the skull through the vertex without intravenous contrast. COMPARISON:  None available FINDINGS: Brain: No evidence of acute infarction, hemorrhage, hydrocephalus, extra-axial collection or mass lesion/mass effect. Well preserved brain volume for age. Chronic small vessel ischemia in the hemispheric white matter. Vascular: No hyperdense vessel or unexpected calcification. 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 Sinuses/Orbits: Negative IMPRESSION: No acute or reversible finding. Electronically Signed   By: Monte Fantasia M.D.   On: 09/11/2020 04:15   US Carotid Bilateral  Result Date: 09/11/2020 CLINICAL DATA:  Syncope EXAM: BILATERAL CAROTID DUPLEX ULTRASOUND TECHNIQUE: Pearline Cables scale imaging, color Doppler and duplex ultrasound were performed of bilateral carotid and vertebral arteries in the neck. COMPARISON:  None. FINDINGS: Criteria: Quantification of carotid stenosis is based on velocity parameters that correlate the residual internal carotid diameter with NASCET-based stenosis levels, using the diameter of the distal internal carotid lumen as the denominator for stenosis measurement. The following velocity measurements were obtained: RIGHT ICA: 71/20 cm/sec CCA: [id number] cm/sec SYSTOLIC ICA/CCA RATIO:  1.2 ECA:  67 cm/sec LEFT ICA: 77/23 cm/sec CCA: 0000000 cm/sec SYSTOLIC ICA/CCA  RATIO:  1.2 ECA:  110 cm/sec RIGHT CAROTID ARTERY: Trace smooth heterogeneous atherosclerotic plaque in the proximal internal carotid artery. By peak systolic velocity criteria, the estimated stenosis is less than 50%. RIGHT VERTEBRAL ARTERY:  Patent with normal antegrade flow. LEFT CAROTID ARTERY: Trace smooth heterogeneous atherosclerotic plaque in the proximal internal carotid artery. By peak systolic velocity criteria, the estimated stenosis is less than 50%. LEFT VERTEBRAL ARTERY:  Patent with normal antegrade flow. IMPRESSION: 1. Mild (1-49%) stenosis proximal right internal carotid artery secondary to trace smooth heterogeneous atherosclerotic plaque. 2. Mild (1-49%) stenosis proximal left internal carotid artery secondary to trace smooth heterogeneous atherosclerotic plaque. 3. Both vertebral arteries are patent with normal antegrade flow. Signed, Criselda Peaches, MD, [facility] Electronically Signed   By: Jacqulynn Cadet M.D.   On: 09/11/2020 07:18   DG Chest Portable 1 View  Result Date: 09/10/2020 CLINICAL DATA:  Syncope.  Fall from home. EXAM: PORTABLE CHEST 1 VIEW COMPARISON:  CT 05/14/2018 FINDINGS: Heart size and pulmonary vascularity are normal. Emphysematous changes in the lungs. Peribronchial thickening and bronchiectasis. No airspace disease or consolidation. No pleural effusions. No pneumothorax. Mediastinal contours appear intact. Calcified and tortuous aorta. Moderate-sized esophageal hiatal hernia behind the heart. IMPRESSION: Emphysematous and chronic bronchitic changes in the lungs. No evidence of active pulmonary disease. Electronically Signed   By: Lucienne Capers M.D.   On: 09/10/2020 18:29   DG Hip Unilat With Pelvis 2-3 Views Left  Result Date: 09/10/2020 CLINICAL DATA:  Fall and left hip pain. EXAM: DG HIP (WITH OR WITHOUT PELVIS) 2-3V LEFT COMPARISON:  None. FINDINGS: There is no acute fracture or  dislocation. The bones are osteopenic. Mild bilateral hip arthritic changes. Dense stool throughout the distal colon outlining severe diverticulosis. The soft tissues are unremarkable. IMPRESSION: No acute fracture or dislocation. Electronically Signed   By: Anner Crete M.D.   On: 09/10/2020 19:33     Assessment/Plan Active Problems:   Syncope  Syncope Unclear etiology ??  Hypotension related to autonomic neuropathy from Parkinson's disease Will place patient on cardiac monitor to rule out arrhythmias Obtain 2D echocardiogram to assess LVEF and rule out aortic stenosis     Lower extremity weakness Rule out acute CVA in a patient with a history of atrial fibrillation not on long-term anticoagulation Initial CT scan of the head without contrast was negative for acute hemorrhage Obtain MRI of the brain for further evaluation Continue Plavix and statins Obtain PT/ST/OT consult Hold hydrochlorothiazide for now    Parkinson's disease Continue Sinemet Place patient on fall precautions    Hypokalemia Related to diuretic use Supplement potassium  DVT prophylaxis: Lovenox  Code Status: DO NOT RESUSCITATE Family Communication: Greater than 50% of time was spent discussing patient's condition and plan of care with her and her daughter at the bedside.  All questions and concerns have been addressed.  They verbalized understanding and agree with the plan. Disposition Plan: Back to previous home environment Consults called: Cardiology Status: Observation    Jaze Rodino MD [facility]     09/11/2020, 9:58 AM

## 2020-09-11 NOTE — ED Provider Notes (Signed)
[facility] Emergency Department Provider Note   ____________________________________________   Event Date/Time   First MD Initiated Contact with Patient 09/11/20 0231     (approximate)  I have reviewed the triage vital signs and the nursing notes.   HISTORY  Chief Complaint Extremity Weakness (C/o bilat leg weakness tonight when getting up to go to the bathroom. Pt. Was seen in ED tonight for fall with hip and shoulder pain. Pt. Able to move bilat feet, and sensation intact.)    HPI Tiffany Ramirez is a 87 y.o. female who returns to the ED via EMS from home with a chief complaint of bilateral leg weakness.  Patient with a history of Parkinson's disease, hypertension, hyperlipidemia, GERD on Plavix.  She was seen in the ED yesterday evening status post fall with left hip pain.  She had a syncopal episode yesterday.  Reports feeling dizzy after getting up from a sitting position and then "falling out".  She was able to ambulate afterwards and encouraged by her PCP to seek evaluation in the emergency department.  X-ray imaging of left hip and chest x-ray were negative.  Patient declined CT head and desired discharge home.  Currently on nitrofurantoin for UTI.  Treated for skin tears.  Patient awoke to use the restroom prior to arrival and required assistance from her daughter and her spouse to stand due to bilateral leg weakness, more so on the left.  Patient states she usually requires more assistance on her left side at baseline.  Daughter denies slurred speech, facial droop, confusion.  Patient denies back pain, bowel or bladder incontinence.  Denies chest pain or shortness of breath.  Daughter states she has not taken much oral intake all day today.     Past Medical History:  Diagnosis Date   Anxiety    Borderline diabetic    08-2014   Depression    Diabetes mellitus without complication ([place])    Insomnia with sleep apnea, unspecified    Other and  unspecified hyperlipidemia    Parkinson's disease ([place])    Skin cancer, basal cell    Sleep disorder    Unspecified essential hypertension     Patient Active Problem List   Diagnosis Date Noted   Lymphedema 04/04/2020   Chronic venous insufficiency 04/04/2020   Atypical chest pain 06/14/2018   Paroxysmal atrial fibrillation ([place]) 06/14/2018   Sinus bradycardia 06/13/2018   Iron malabsorption 05/08/2018   Iron deficiency anemia due to chronic blood loss 05/07/2018   Shortness of breath 05/07/2018   Type 2 diabetes mellitus with peripheral angiopathy ([place]) 08/22/2017   Medicare annual wellness visit, initial 02/15/2016   Swelling of limb 12/28/2015   Chronic hyperglycemia 08/13/2014   Paralysis agitans ([place]) 08/28/2013   Hyperlipidemia, mixed 07/28/2013   Arterial vascular disease 07/28/2013   Insomnia with sleep apnea    Sleep disorder    Essential hypertension     Past Surgical History:  Procedure Laterality Date   CATARACT EXTRACTION Bilateral     Prior to Admission medications   Medication Sig Start Date End Date Taking? Authorizing Provider  alum & mag hydroxide-simeth (MAALOX MAX) 400-400-40 MG/5ML suspension Take 5 mLs by mouth every 6 (six) hours as needed for indigestion. 08/12/18   Rudene Re, MD  carbidopa-levodopa (SINEMET CR) 50-200 MG tablet Take 1 tablet by mouth at bedtime. 02/18/18   Star Age, MD  carbidopa-levodopa (SINEMET IR) 25-100 MG tablet Take 1 tablet by mouth 6 (six) times daily. At 7,  10, 1 PM, 4 PM, 7 PM, and 10 PM. 02/18/18   Star Age, MD  clopidogrel (PLAVIX) 75 MG tablet Take 75 mg by mouth daily.    [provider]  dexlansoprazole (DEXILANT) 60 MG capsule Take by mouth. 03/16/20 06/14/20  [provider]  famotidine (PEPCID) 40 MG tablet Take 40 mg by mouth daily. 03/21/20   [provider]  gabapentin (NEURONTIN) 300 MG capsule  03/21/20   [provider]  hydrochlorothiazide (HYDRODIURIL) 12.5 MG  tablet Take 12.5 mg by mouth daily. 07/18/18   [provider]  linaclotide Rolan Lipa) 145 MCG CAPS capsule Take 1 capsule (145 mcg total) by mouth daily before breakfast. 08/28/18   Star Age, MD  ondansetron (ZOFRAN ODT) 4 MG disintegrating tablet Take 1 tablet (4 mg total) by mouth every 8 (eight) hours as needed. 08/12/18   Alfred Levins, [place], MD  pantoprazole (PROTONIX) 20 MG tablet Take 40 mg by mouth daily.     [provider]  Probiotic Product (PROBIOTIC PO) Take by mouth.    [provider]  simvastatin (ZOCOR) 20 MG tablet Take 20 mg by mouth daily.     [provider]  sucralfate (CARAFATE) 1 GM/10ML suspension Take by mouth. 07/17/18 07/17/19  [provider]    Allergies Codeine, Phenobarbital, and Other  Family History  Problem Relation Age of Onset   Heart failure Mother    Hyperlipidemia Mother    Heart attack Father    Diabetes Father    Brain cancer Sister    Psychiatric Illness Sister    Stroke Sister    Deep vein thrombosis Sister    Lung cancer Brother     Social History Social History   Tobacco Use   Smoking status: Never   Smokeless tobacco: Never  Substance Use Topics   Alcohol use: No   Drug use: No    Review of Systems  Constitutional: Positive for weakness.  No fever/chills Eyes: No visual changes. ENT: No sore throat. Cardiovascular: Denies chest pain. Respiratory: Denies shortness of breath. Gastrointestinal: No abdominal pain.  No nausea, no vomiting.  No diarrhea.  No constipation. Genitourinary: Negative for dysuria. Musculoskeletal: Negative for back pain. Skin: Negative for rash. Neurological: Positive for syncope.  Negative for headaches, focal weakness or numbness.   ____________________________________________   PHYSICAL EXAM:  VITAL SIGNS: ED Triage Vitals  Enc Vitals Group     BP 09/11/20 0233 126/63     Pulse Rate 09/11/20 0233 (!) 56     Resp 09/11/20 0233 20     Temp  09/11/20 0233 (!) 97.5 F (36.4 C)     Temp Source 09/11/20 0233 Oral     SpO2 09/11/20 0233 97 %     Weight 09/11/20 0228 105 lb 13.1 oz (48 kg)     Height 09/11/20 0228 '5\' 6"'$  (1.676 m)     Head Circumference --      Peak Flow --      Pain Score 09/11/20 0228 0     Pain Loc --      Pain Edu? --      Excl. in [place]? --     Constitutional: Alert and oriented.  Elderly appearing and in no acute distress. Eyes: Conjunctivae are normal. PERRL. EOMI. Head: Atraumatic. Nose: No congestion/rhinnorhea. Mouth/Throat: Mucous membranes are mildly dry. Neck: No stridor.  No cervical spine tenderness to palpation. Cardiovascular: Normal rate, regular rhythm. Grossly normal heart sounds.  Good peripheral circulation. Respiratory: Normal respiratory effort.  No retractions. Lungs CTAB. Gastrointestinal: Soft and nontender to light or deep palpation. No distention. No abdominal bruits. No CVA tenderness. Musculoskeletal: No spinal tenderness to palpation.  Elvis is stable.  Full range of motion left hip without pain.  No lower extremity tenderness nor edema.  No joint effusions. Neurologic: Alert and oriented to person and place.  CN II to XII grossly intact.  Normal speech and language.  4/5 strength LLE.   Skin:  Skin is warm, dry and intact. No rash noted. Psychiatric: Mood and affect are normal. Speech and behavior are normal.  ____________________________________________   LABS (all labs ordered are listed, but only abnormal results are displayed)  Labs Reviewed  CBC WITH DIFFERENTIAL/PLATELET - Abnormal; Notable for the following components:      Result Value   Hemoglobin 10.1 (*)    HCT 32.5 (*)    MCV 79.3 (*)    MCH 24.6 (*)    All other components within normal limits  COMPREHENSIVE METABOLIC PANEL - Abnormal; Notable for the following components:   Potassium 3.2 (*)    BUN 24 (*)    Calcium 8.6 (*)    Total Protein 6.3 (*)    All other components within normal limits  RESP  PANEL BY RT-PCR (FLU A&B, COVID) ARPGX2  URINALYSIS, COMPLETE (UACMP) WITH MICROSCOPIC  TROPONIN I (HIGH SENSITIVITY)  TROPONIN I (HIGH SENSITIVITY)   ____________________________________________  EKG  ED ECG REPORT I, Keni Elison J, the attending physician, personally viewed and interpreted this ECG.   Date: 09/11/2020  EKG Time: 0331  Rate: 54  Rhythm: sinus bradycardia  Axis: Normal  Intervals:none  ST&T Change: Nonspecific  ____________________________________________  RADIOLOGY I, Kairyn Olmeda J, personally viewed and evaluated these images (plain radiographs) as part of my medical decision making, as well as reviewing the written report by the radiologist.  ED MD interpretation: Lumbar spine negative for acute traumatic injury; CT head without [place]  Official radiology report(s): DG Lumbar Spine Complete  Result Date: 09/11/2020 CLINICAL DATA:  Fall and back pain. EXAM: LUMBAR SPINE - COMPLETE 4+ VIEW COMPARISON:  CT abdomen pelvis dated 07/29/2018 FINDINGS: Five lumbar type vertebra. There is no acute fracture or subluxation of the lumbar spine. There is osteopenia with multilevel degenerative changes there is advanced atherosclerotic calcification of the aorta. Contrast mixed with stool noted in the colon and outlining severe sigmoid diverticulosis. IMPRESSION: 1. No acute/traumatic lumbar spine pathology. 2. Sigmoid diverticulosis. Electronically Signed   By: Anner Crete M.D.   On: 09/11/2020 03:39   CT Head Wo Contrast  Result Date: 09/11/2020 CLINICAL DATA:  Simple syncope with abnormal neuro exam.  Weakness. EXAM: CT HEAD WITHOUT CONTRAST TECHNIQUE: Contiguous axial images were obtained from the base of the skull through the vertex without intravenous contrast. COMPARISON:  None available FINDINGS: Brain: No evidence of acute infarction, hemorrhage, hydrocephalus, extra-axial collection or mass lesion/mass effect. Well preserved brain volume for age. Chronic small vessel  ischemia in the hemispheric white matter. Vascular: No hyperdense vessel or unexpected calcification. 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 Sinuses/Orbits: Negative IMPRESSION: No acute or reversible finding. Electronically Signed   By: Monte Fantasia M.D.   On: 09/11/2020 04:15   DG Chest Portable 1 View  Result Date: 09/10/2020 CLINICAL DATA:  Syncope.  Fall from home. EXAM: PORTABLE CHEST 1 VIEW COMPARISON:  CT 05/14/2018 FINDINGS: Heart size and pulmonary vascularity are normal. Emphysematous changes in the lungs. Peribronchial thickening and bronchiectasis. No airspace disease or consolidation.  No pleural effusions. No pneumothorax. Mediastinal contours appear intact. Calcified and tortuous aorta. Moderate-sized esophageal hiatal hernia behind the heart. IMPRESSION: Emphysematous and chronic bronchitic changes in the lungs. No evidence of active pulmonary disease. Electronically Signed   By: Lucienne Capers M.D.   On: 09/10/2020 18:29   DG Hip Unilat With Pelvis 2-3 Views Left  Result Date: 09/10/2020 CLINICAL DATA:  Fall and left hip pain. EXAM: DG HIP (WITH OR WITHOUT PELVIS) 2-3V LEFT COMPARISON:  None. FINDINGS: There is no acute fracture or dislocation. The bones are osteopenic. Mild bilateral hip arthritic changes. Dense stool throughout the distal colon outlining severe diverticulosis. The soft tissues are unremarkable. IMPRESSION: No acute fracture or dislocation. Electronically Signed   By: Anner Crete M.D.   On: 09/10/2020 19:33    ____________________________________________   PROCEDURES  Procedure(s) performed (including Critical Care):  .1-3 Lead EKG Interpretation  Date/Time: 09/11/2020 2:54 AM Performed by: Paulette Blanch, MD Authorized by: Paulette Blanch, MD     Interpretation: abnormal     ECG rate:  56   ECG rate assessment: bradycardic     Rhythm: sinus bradycardia     Ectopy: none     Conduction: normal   Comments:     Patient placed  on cardiac monitor to evaluate for arrhythmias   ____________________________________________   INITIAL IMPRESSION / ASSESSMENT AND PLAN / ED COURSE  As part of my medical decision making, I reviewed the following data within the electronic MEDICAL RECORD NUMBER History obtained from family, Nursing notes reviewed and incorporated, Labs reviewed, EKG interpreted, Old chart reviewed, Radiograph reviewed, Discussed with admitting physician, and Notes from prior ED visits     87 year old female who returns to the ED this time for generalized weakness in both legs.  Differential diagnosis includes but is not limited to TIA/CVA, infectious, metabolic, ACS etiologies, etc.  Will obtain cardiac panel, CT head.  Although patient does not complain of low back pain, will obtain x-rays of lumbar spine.  Low suspicion for cauda equina syndrome or acute traumatic injury causing leg weakness.  Unclear time of onset of left leg weakness as patient states she is normally weaker on the left.  Although CVA is still in the differential, unclear time of onset excludes administration of tPA.  Clinical Course as of 09/11/20 0539  Sat Sep 11, 2020  [id number] Updated patient and daughter of all test results.  Recommended admission for syncope, CVA work-up.  Patient is agreeable.  Will discuss with hospital services. [JS]    Clinical Course User Index [JS] Paulette Blanch, MD     ____________________________________________   FINAL CLINICAL IMPRESSION(S) / ED DIAGNOSES  Final diagnoses:  Weakness generalized  Syncope, unspecified syncope type  Hypokalemia  Dehydration     ED Discharge Orders     None        Note:  This document was prepared using Dragon voice recognition software and may include unintentional dictation errors.    Paulette Blanch, MD 09/11/20 [phone]

## 2020-09-11 NOTE — ED Notes (Signed)
Pharmacy states they did not receive med list. Messaged pharmacist with pt's home medication list as provided by daughter.

## 2020-09-11 NOTE — ED Triage Notes (Signed)
C/o bilat leg weakness tonight when getting up to go to the bathroom. Pt. Was seen in ED tonight for fall with hip and shoulder pain. Pt. Able to move bilat feet, and sensation intact.

## 2020-09-11 NOTE — ED Notes (Signed)
Patient ambulated to restroom with one person assist.

## 2020-09-11 NOTE — ED Notes (Signed)
Messaged RN bed assigned 

## 2020-09-12 ENCOUNTER — Observation Stay: Payer: Medicare Other

## 2020-09-12 DIAGNOSIS — R55 Syncope and collapse: Secondary | ICD-10-CM | POA: Diagnosis not present

## 2020-09-12 LAB — BASIC METABOLIC PANEL
Anion gap: 12 (ref 5–15)
BUN: 19 mg/dL (ref 8–23)
CO2: 25 mmol/L (ref 22–32)
Calcium: 9 mg/dL (ref 8.9–10.3)
Chloride: 100 mmol/L (ref 98–111)
Creatinine, Ser: 0.57 mg/dL (ref 0.44–1.00)
GFR, Estimated: >60 mL/min (ref >60)
Glucose, Bld: 102 mg/dL — ABNORMAL HIGH (ref 70–99)
Potassium: 3.4 mmol/L — ABNORMAL LOW (ref 3.5–5.1)
Sodium: 137 mmol/L (ref 135–145)

## 2020-09-12 LAB — LIPID PANEL
Cholesterol: 124 mg/dL (ref 0–200)
HDL: 64 mg/dL (ref >40)
LDL Cholesterol: 48 mg/dL (ref 0–99)
Total CHOL/HDL Ratio: 1.9 RATIO
Triglycerides: 62 mg/dL (ref <150)
VLDL: 12 mg/dL (ref 0–40)

## 2020-09-12 LAB — HEMOGLOBIN A1C
Hgb A1c MFr Bld: 5.7 % — ABNORMAL HIGH (ref 4.8–5.6)
Mean Plasma Glucose: 116.89 mg/dL

## 2020-09-12 LAB — MAGNESIUM: Magnesium: 2.1 mg/dL (ref 1.7–2.4)

## 2020-09-12 MED ORDER — POLYETHYLENE GLYCOL 3350 17 G PO PACK: 17.0000 g | PACK | Freq: Every day | ORAL | Status: DC

## 2020-09-12 MED ORDER — POTASSIUM CHLORIDE ER 10 MEQ PO TBCR: 10.0000 meq | EXTENDED_RELEASE_TABLET | Freq: Every day | ORAL | 1 refills | Status: DC

## 2020-09-12 NOTE — Discharge Summary (Signed)
Tiffany Ramirez [id number] DOB: [birth year] DOA: 09/11/2020  PCP: Rusty Aus, MD  Admit date: 09/11/2020 Discharge date: 09/12/2020  Time spent: 35 minutes  Recommendations for Outpatient Follow-up:  Close f/u with pcp, gastroenterology, and neurology F/u with cardiology for consideration of heart monitor  BMP 1-2 weeks to assess potassium    Discharge Diagnoses:  Principal Problem:   Syncope Active Problems:   Essential hypertension   Paroxysmal atrial fibrillation ([place])   Weakness   Discharge Condition: stable  Diet recommendation: regular  Filed Weights   09/11/20 0228 09/12/20 0500  Weight: 48 kg 49.6 kg    History of present illness:   Tiffany Ramirez is a 87 y.o. female with medical history significant for atrial fibrillation, Parkinson's disease, depression, hypertension who was initially seen in the ER 1 day prior to admission after she had what sounds like a syncopal episode. Patient states that she had gotten up from a sitting position but then she got up too fast, she does not remember feeling dizzy or lightheaded.  She remembers grabbing her walker and the next thing she knows she is on the ground.  She landed on her left hip and shoulder and is unsure if she lost consciousness. She contacted her primary care provider who advised to go to the ER for evaluation due to left hip pain. She was seen in the ER and imaging was negative for any fracture.  Hospitalization was recommended by the ER provider for syncope evaluation which she declined and went home. Her daughter states that 2 hours after he got home patient wanted to use the bathroom and was unable to ambulate to the bathroom.  She states that her legs felt like lead and she could not move them.  This concerned her family and they brought her back to the emergency room for further evaluation. Patient denies having any back pain, no urinary or fecal incontinence or saddle anesthesia. She denies having any  chest pain, no shortness of breath, no nausea, no vomiting, no dizziness, no lightheadedness, no headache, no cough, no fever, no chills, no urinary frequency, no nocturia, no dysuria, no palpitations or diaphoresis.  Hospital Course:  Patient admitted after presenting to ED two days ago after syncopal event and fall at home. Evaluated in ED and discharged home. ED eval consisted of imaging which revealed no fracture. At home experienced difficulty moving and so returned to the hospital. W/u here included CT and MRI which showed no signs of stroke and neuro exam non-focal, think the difficulty moving a consequence of patient's advancing parkinson's disease. No hip pain so did not pursue further imaging of pelvix. Cardiology evaluated patient's syncope and deemed it likely a consequence of her parkinson's disease. Carotid dopplers and TTE w/o sig abnormality. They did advise close f/u for consideration of placement of a cardiac monitor. Potassium mildly low, patient not taking home potassium, this was re-started at discharge and will need repeat labs in 1-2 weeks  Procedures: none   Consultations: cardiology  Discharge Exam: Vitals:   09/12/20 0400 09/12/20 0757  BP: 140/65 (!) 114/54  Pulse: 63 73  Resp: 16 17  Temp: 97.8 F (36.6 C) (!) 97.4 F (36.3 C)  SpO2: 100% 100%    General: NAD Cardiovascular: RRR, soft systolic murmur Respiratory: faint rales at bases otherwise clear [place]url: cn2-12 grossly intact, 5/5 upper and lower strength, distal sensation intact and symmetric  Discharge Instructions   Discharge Instructions     Diet - low sodium  heart healthy   Complete by: As directed    Discharge wound care:   Complete by: As directed    Change dressing daily   Increase activity slowly   Complete by: As directed       Allergies as of 09/12/2020       Reactions   Codeine    Other reaction(s): Other (See Comments) Sick on her stomach   Phenobarbital Other (See Comments)    Overall restlessness   Other Anxiety   Benadryal        Medication List     TAKE these medications    alum & mag hydroxide-simeth [id number] MG/5ML suspension Commonly known as: Maalox Max Take 5 mLs by mouth every 6 (six) hours as needed for indigestion.   carbidopa-levodopa 25-100 MG tablet Commonly known as: SINEMET IR Take 1 tablet by mouth 6 (six) times daily. At 7, 10, 1 PM, 4 PM, 7 PM, and 10 PM. What changed: Another medication with the same name was removed. Continue taking this medication, and follow the directions you see here.   clopidogrel 75 MG tablet Commonly known as: PLAVIX Take 75 mg by mouth daily.   dexlansoprazole 60 MG capsule Commonly known as: DEXILANT Take by mouth.   famotidine 40 MG tablet Commonly known as: PEPCID Take 40 mg by mouth daily.   gabapentin 300 MG capsule Commonly known as: NEURONTIN   hydrochlorothiazide 12.5 MG tablet Commonly known as: HYDRODIURIL Take 12.5 mg by mouth daily.   Inbrija 42 MG Caps Generic drug: Levodopa SMARTSIG:Capsule(s) Via Inhaler   linaclotide 145 MCG Caps capsule Commonly known as: LINZESS Take 1 capsule (145 mcg total) by mouth daily before breakfast.   nitrofurantoin (macrocrystal-monohydrate) 100 MG capsule Commonly known as: MACROBID Take 100 mg by mouth 2 (two) times daily.   ondansetron 4 MG disintegrating tablet Commonly known as: Zofran ODT Take 1 tablet (4 mg total) by mouth every 8 (eight) hours as needed.   ondansetron 8 MG tablet Commonly known as: ZOFRAN TAKE 1 TABLET (8 MG TOTAL) BY MOUTH EVERY 4 (FOUR) HOURS AS NEEDED FOR NAUSEA FOR UP TO 20 DAYS   pantoprazole 20 MG tablet Commonly known as: PROTONIX Take 40 mg by mouth daily.   potassium chloride 10 MEQ tablet Commonly known as: KLOR-CON Take 1 tablet (10 mEq total) by mouth daily.   PROBIOTIC PO Take by mouth.   simvastatin 20 MG tablet Commonly known as: ZOCOR Take 20 mg by mouth daily.   sucralfate 1  GM/10ML suspension Commonly known as: CARAFATE Take by mouth.               Discharge Care Instructions  (From admission, onward)           Start     Ordered   09/12/20 0000  Discharge wound care:       Comments: Change dressing daily   09/12/20 0957           Allergies  Allergen Reactions   Codeine     Other reaction(s): Other (See Comments) Sick on her stomach   Phenobarbital Other (See Comments)    Overall restlessness   Other Anxiety    Benadryal    Follow-up Information     Your neurologist and gastroenterologist as scheduled Follow up.          Rusty Aus, MD. Schedule an appointment as soon as possible for a visit.   Specialty: Internal Medicine Contact information: [facility] [address]  [place] [address] [phone]         Isaias Cowman, MD Follow up.   Specialty: Cardiology Contact information: [facility] [place] [address] [phone]                  The results of significant diagnostics from this hospitalization (including imaging, microbiology, ancillary and laboratory) are listed below for reference.    Significant Diagnostic Studies: DG Lumbar Spine Complete  Result Date: 09/11/2020 CLINICAL DATA:  Fall and back pain. EXAM: LUMBAR SPINE - COMPLETE 4+ VIEW COMPARISON:  CT abdomen pelvis dated 07/29/2018 FINDINGS: Five lumbar type vertebra. There is no acute fracture or subluxation of the lumbar spine. There is osteopenia with multilevel degenerative changes there is advanced atherosclerotic calcification of the aorta. Contrast mixed with stool noted in the colon and outlining severe sigmoid diverticulosis. IMPRESSION: 1. No acute/traumatic lumbar spine pathology. 2. Sigmoid diverticulosis. Electronically Signed   By: Anner Crete M.D.   On: 09/11/2020 03:39   CT HEAD WO CONTRAST (5MM)  Result Date: 09/12/2020 CLINICAL DATA:  Neuro deficit  with acute stroke suspected EXAM: CT HEAD WITHOUT CONTRAST TECHNIQUE: Contiguous axial images were obtained from the base of the skull through the vertex without intravenous contrast. COMPARISON:  Head CT and brain MRI from yesterday FINDINGS: Brain: No evidence of acute infarction, hemorrhage, hydrocephalus, extra-axial collection or mass lesion/mass effect. Chronic small vessel ischemia that is confluent in the deep white matter. Age normal brain volume. Vascular: No hyperdense vessel or unexpected calcification. Skull: Normal. Negative for fracture or focal lesion. Sinuses/Orbits: No acute finding. IMPRESSION: 1. No new or acute finding. 2. Chronic small vessel ischemia. Electronically Signed   By: Monte Fantasia M.D.   On: 09/12/2020 04:51   CT Head Wo Contrast  Result Date: 09/11/2020 CLINICAL DATA:  Simple syncope with abnormal neuro exam.  Weakness. EXAM: CT HEAD WITHOUT CONTRAST TECHNIQUE: Contiguous axial images were obtained from the base of the skull through the vertex without intravenous contrast. COMPARISON:  None available FINDINGS: Brain: No evidence of acute infarction, hemorrhage, hydrocephalus, extra-axial collection or mass lesion/mass effect. Well preserved brain volume for age. Chronic small vessel ischemia in the hemispheric white matter. Vascular: No hyperdense vessel or unexpected calcification. 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 Sinuses/Orbits: Negative IMPRESSION: No acute or reversible finding. Electronically Signed   By: Monte Fantasia M.D.   On: 09/11/2020 04:15   MR BRAIN WO CONTRAST  Result Date: 09/11/2020 CLINICAL DATA:  Neuro deficit, acute, stroke suspected; leg weakness EXAM: MRI HEAD WITHOUT CONTRAST TECHNIQUE: Multiplanar, multiecho pulse sequences of the brain and surrounding structures were obtained without intravenous contrast. COMPARISON:  None. FINDINGS: Brain: There is no acute infarction or intracranial hemorrhage. There is no  intracranial mass, mass effect, or edema. There is no hydrocephalus or extra-axial fluid collection. Prominence of the ventricles and sulci reflects generalized parenchymal volume loss. Patchy and confluent areas of T2 hyperintensity in the supratentorial greater than pontine white matter are nonspecific but probably reflect moderate chronic microvascular ischemic changes. There is a small chronic infarct of the left thalamus. Vascular: Major vessel flow voids at the skull base are preserved. Skull and upper cervical spine: Normal marrow signal is preserved. Sinuses/Orbits: Paranasal sinuses are aerated. Orbits are unremarkable. Other: Sella is unremarkable.  Mastoid air cells are clear. IMPRESSION: No evidence of recent infarction, hemorrhage, or mass. Moderate chronic microvascular ischemic changes. Small chronic left thalamic infarct. Electronically Signed  By: Macy Mis M.D.   On: 09/11/2020 11:24   US Carotid Bilateral  Result Date: 09/11/2020 CLINICAL DATA:  Syncope EXAM: BILATERAL CAROTID DUPLEX ULTRASOUND TECHNIQUE: Pearline Cables scale imaging, color Doppler and duplex ultrasound were performed of bilateral carotid and vertebral arteries in the neck. COMPARISON:  None. FINDINGS: Criteria: Quantification of carotid stenosis is based on velocity parameters that correlate the residual internal carotid diameter with NASCET-based stenosis levels, using the diameter of the distal internal carotid lumen as the denominator for stenosis measurement. The following velocity measurements were obtained: RIGHT ICA: 71/20 cm/sec CCA: [id number] cm/sec SYSTOLIC ICA/CCA RATIO:  1.2 ECA:  67 cm/sec LEFT ICA: 77/23 cm/sec CCA: 0000000 cm/sec SYSTOLIC ICA/CCA RATIO:  1.2 ECA:  110 cm/sec RIGHT CAROTID ARTERY: Trace smooth heterogeneous atherosclerotic plaque in the proximal internal carotid artery. By peak systolic velocity criteria, the estimated stenosis is less than 50%. RIGHT VERTEBRAL ARTERY:  Patent with normal antegrade flow.  LEFT CAROTID ARTERY: Trace smooth heterogeneous atherosclerotic plaque in the proximal internal carotid artery. By peak systolic velocity criteria, the estimated stenosis is less than 50%. LEFT VERTEBRAL ARTERY:  Patent with normal antegrade flow. IMPRESSION: 1. Mild (1-49%) stenosis proximal right internal carotid artery secondary to trace smooth heterogeneous atherosclerotic plaque. 2. Mild (1-49%) stenosis proximal left internal carotid artery secondary to trace smooth heterogeneous atherosclerotic plaque. 3. Both vertebral arteries are patent with normal antegrade flow. Signed, Criselda Peaches, MD, [facility] Electronically Signed   By: Jacqulynn Cadet M.D.   On: 09/11/2020 07:18   DG Chest Portable 1 View  Result Date: 09/10/2020 CLINICAL DATA:  Syncope.  Fall from home. EXAM: PORTABLE CHEST 1 VIEW COMPARISON:  CT 05/14/2018 FINDINGS: Heart size and pulmonary vascularity are normal. Emphysematous changes in the lungs. Peribronchial thickening and bronchiectasis. No airspace disease or consolidation. No pleural effusions. No pneumothorax. Mediastinal contours appear intact. Calcified and tortuous aorta. Moderate-sized esophageal hiatal hernia behind the heart. IMPRESSION: Emphysematous and chronic bronchitic changes in the lungs. No evidence of active pulmonary disease. Electronically Signed   By: Lucienne Capers M.D.   On: 09/10/2020 18:29   DG UGI W DOUBLE CM (HD BA)  Result Date: 09/03/2020 CLINICAL DATA:  Epigastric pain after eating. EXAM: UPPER GI SERIES WITHOUT KUB TECHNIQUE: Routine upper GI series was performed with thin/high density/water soluble barium. FLUOROSCOPY TIME:  Fluoroscopy Time:  0.9 minute Radiation Exposure Index (if provided by the fluoroscopic device): 5 mGy Number of Acquired Spot Images: 0 COMPARISON:  None. FINDINGS: UPPER GI SERIES: Examination of the esophagus demonstrated normal esophageal motility.  Moderate volume tracheal aspiration in with spontaneous cough reflex. Normal esophageal morphology without evidence of esophagitis or ulceration. No esophageal stricture, diverticula, or mass lesion. Moderate-sized hiatal hernia. Moderate severe gastroesophageal reflux extending into the upper-mid esophagus. Examination of the stomach demonstrated normal rugal folds and areae gastricae. Gastric mucosa appeared unremarkable without evidence of ulceration, scarring, or mass lesion. Gastric motility and emptying was normal. Fluoroscopic examination of the duodenum demonstrates normal motility and morphology without evidence of ulceration or mass lesion. IMPRESSION: 1. Moderate volume tracheal aspiration in with spontaneous cough reflex. Recommend further evaluation with speech pathology. 2. Moderate-sized hiatal hernia. Moderate severe gastroesophageal reflux extending into the upper-mid esophagus. Electronically Signed   By: Kathreen Devoid   On: 09/03/2020 10:09   ECHOCARDIOGRAM COMPLETE  Result Date: 09/11/2020    ECHOCARDIOGRAM REPORT   Patient Name:   ANZLEIGH AMAYA Date of Exam: 09/11/2020 Medical Rec #:  [id number]         Height:       66.0 in Accession #:    [id number]        Weight:       105.8 lb Date of Birth:  [birth year]         BSA:          1.526 m Patient Age:    53 years          BP:           142/73 mmHg Patient Gender: F                 HR:           63 bpm. Exam Location:  [facility] Procedure: 2D Echo and Strain Analysis Indications:     Syncope R55  History:         Patient has no prior history of Echocardiogram examinations.  Sonographer:     Kathlen Brunswick RDCS Referring Phys:  [id number] Athena Masse Diagnosing Phys: Isaias Cowman MD  Sonographer Comments: Global longitudinal strain was attempted. IMPRESSIONS  1. Left ventricular ejection fraction, by estimation, is 55 to 60%. The left ventricle has normal function. The left ventricle has no regional wall motion abnormalities. Left  ventricular diastolic parameters are consistent with Grade I diastolic dysfunction (impaired relaxation).  2. Right ventricular systolic function is normal. The right ventricular size is normal.  3. The mitral valve is normal in structure. Mild mitral valve regurgitation. No evidence of mitral stenosis.  4. The aortic valve is normal in structure. Aortic valve regurgitation is not visualized. No aortic stenosis is present.  5. The inferior vena cava is normal in size with greater than 50% respiratory variability, suggesting right atrial pressure of 3 mmHg. FINDINGS  Left Ventricle: Left ventricular ejection fraction, by estimation, is 55 to 60%. The left ventricle has normal function. The left ventricle has no regional wall motion abnormalities. The left ventricular internal cavity size was normal in size. There is  no left ventricular hypertrophy. Left ventricular diastolic parameters are consistent with Grade I diastolic dysfunction (impaired relaxation). Right Ventricle: The right ventricular size is normal. No increase in right ventricular wall thickness. Right ventricular systolic function is normal. Left Atrium: Left atrial size was normal in size. Right Atrium: Right atrial size was normal in size. Pericardium: There is no evidence of pericardial effusion. Mitral Valve: The mitral valve is normal in structure. Mild mitral valve regurgitation. No evidence of mitral valve stenosis. MV peak gradient, 12.7 mmHg. The mean mitral valve gradient is 4.0 mmHg. Tricuspid Valve: The tricuspid valve is normal in structure. Tricuspid valve regurgitation is mild . No evidence of tricuspid stenosis. Aortic Valve: The aortic valve is normal in structure. Aortic valve regurgitation is not visualized. No aortic stenosis is present. Pulmonic Valve: The pulmonic valve was normal in structure. Pulmonic valve regurgitation is not visualized. No evidence of pulmonic stenosis. Aorta: The aortic root is normal in size and structure.  Venous: The inferior vena cava is normal in size with greater than 50% respiratory variability, suggesting right atrial pressure of 3 mmHg. IAS/Shunts: No atrial level shunt detected by color flow Doppler.  LEFT VENTRICLE PLAX 2D LVIDd:         2.99 cm  Diastology LVIDs:         2.10 cm  LV e' medial:    4.03 cm/s LV PW:         0.94 cm  LV  E/e' medial:  23.9 LV IVS:        0.94 cm  LV e' lateral:   3.05 cm/s LVOT diam:     1.80 cm  LV E/e' lateral: 31.6 LV SV:         68 LV SV Index:   45 LVOT Area:     2.54 cm  RIGHT VENTRICLE RV Basal diam:  3.27 cm RV S prime:     13.80 cm/s TAPSE (M-mode): 2.1 cm LEFT ATRIUM             Index       RIGHT ATRIUM           Index LA diam:        4.30 cm 2.82 cm/m  RA Area:     10.20 cm LA Vol (A2C):   60.9 ml 39.91 ml/m RA Volume:   21.10 ml  13.83 ml/m LA Vol (A4C):   45.2 ml 29.62 ml/m LA Biplane Vol: 53.4 ml 35.00 ml/m  AORTIC VALVE             PULMONIC VALVE LVOT Vmax:   118.00 cm/s PV Vmax:       0.90 m/s LVOT Vmean:  71.800 cm/s PV Peak grad:  3.2 mmHg LVOT VTI:    0.267 m  AORTA Ao Root diam: 2.80 cm Ao Asc diam:  2.50 cm MITRAL VALVE                TRICUSPID VALVE MV Area (PHT): 1.79 cm     TV Peak grad:   23.9 mmHg MV Area VTI:   1.44 cm     TV Vmax:        2.45 m/s MV Peak grad:  12.7 mmHg MV Mean grad:  4.0 mmHg     SHUNTS MV Vmax:       1.78 m/s     Systemic VTI:  0.27 m MV Vmean:      91.9 cm/s    Systemic Diam: 1.80 cm MV Decel Time: 424 msec MV E velocity: 96.50 cm/s MV A velocity: 139.00 cm/s MV E/A ratio:  0.69 Isaias Cowman MD Electronically signed by Isaias Cowman MD Signature Date/Time: 09/11/2020/12:42:45 PM    Final    DG Hip Unilat With Pelvis 2-3 Views Left  Result Date: 09/10/2020 CLINICAL DATA:  Fall and left hip pain. EXAM: DG HIP (WITH OR WITHOUT PELVIS) 2-3V LEFT COMPARISON:  None. FINDINGS: There is no acute fracture or dislocation. The bones are osteopenic. Mild bilateral hip arthritic changes. Dense stool throughout the  distal colon outlining severe diverticulosis. The soft tissues are unremarkable. IMPRESSION: No acute fracture or dislocation. Electronically Signed   By: Anner Crete M.D.   On: 09/10/2020 19:33    Microbiology: Recent Results (from the past 240 hour(s))  Resp Panel by RT-PCR (Flu A&B, Covid) Nasopharyngeal Swab     Status: None   Collection Time: 09/11/20  3:33 AM   Specimen: Nasopharyngeal Swab; Nasopharyngeal(NP) swabs in vial transport medium  Result Value Ref Range Status   SARS Coronavirus 2 by RT PCR NEGATIVE NEGATIVE Final    Comment: (NOTE) SARS-CoV-2 target nucleic acids are NOT DETECTED.  The SARS-CoV-2 RNA is generally detectable in upper respiratory specimens during the acute phase of infection. The lowest concentration of SARS-CoV-2 viral copies this assay can detect is 138 copies/mL. A negative result does not preclude SARS-Cov-2 infection and should not be used as the sole basis for treatment or other patient management decisions.  A negative result may occur with  improper specimen collection/handling, submission of specimen other than nasopharyngeal swab, presence of viral mutation(s) within the areas targeted by this assay, and inadequate number of viral copies(<138 copies/mL). A negative result must be combined with clinical observations, patient history, and epidemiological information. The expected result is Negative.  Fact Sheet for Patients:  EntrepreneurPulse.com.au  Fact Sheet for Healthcare Providers:  IncredibleEmployment.be  This test is no t yet approved or cleared by the [place] FDA and  has been authorized for detection and/or diagnosis of SARS-CoV-2 by FDA under an Emergency Use Authorization (EUA). This EUA will remain  in effect (meaning this test can be used) for the duration of the COVID-19 declaration under Section 564(b)(1) of the Act, 21 U.S.C.section 360bbb-3(b)(1), unless the authorization is  terminated  or revoked sooner.       Influenza A by PCR NEGATIVE NEGATIVE Final   Influenza B by PCR NEGATIVE NEGATIVE Final    Comment: (NOTE) The Xpert Xpress SARS-CoV-2/FLU/RSV plus assay is intended as an aid in the diagnosis of influenza from Nasopharyngeal swab specimens and should not be used as a sole basis for treatment. Nasal washings and aspirates are unacceptable for Xpert Xpress SARS-CoV-2/FLU/RSV testing.  Fact Sheet for Patients: EntrepreneurPulse.com.au  Fact Sheet for Healthcare Providers: IncredibleEmployment.be  This test is not yet approved or cleared by the [place] FDA and has been authorized for detection and/or diagnosis of SARS-CoV-2 by FDA under an Emergency Use Authorization (EUA). This EUA will remain in effect (meaning this test can be used) for the duration of the COVID-19 declaration under Section 564(b)(1) of the Act, 21 U.S.C. section 360bbb-3(b)(1), unless the authorization is terminated or revoked.  Performed at [facility], [address] [place], [place] [address]      Labs: Basic Metabolic Panel: Recent Labs  Lab 09/10/20 1648 09/11/20 0331  NA 135 136  K 3.4* 3.2*  CL 96* 99  CO2 30 28  GLUCOSE 92 73  BUN 21 24*  CREATININE 0.70 0.78  CALCIUM 8.8* 8.6*   Liver Function Tests: Recent Labs  Lab 09/11/20 0331  AST 15  ALT <5  ALKPHOS 70  BILITOT 1.0  PROT 6.3*  ALBUMIN 3.9   No results for input(s): LIPASE, AMYLASE in the last 168 hours. No results for input(s): AMMONIA in the last 168 hours. CBC: Recent Labs  Lab 09/10/20 1648 09/11/20 0331  WBC 5.2 4.4  NEUTROABS  --  2.8  HGB 10.1* 10.1*  HCT 31.7* 32.5*  MCV 79.3* 79.3*  PLT 199 199   Cardiac Enzymes: No results for input(s): CKTOTAL, CKMB, CKMBINDEX, TROPONINI in the last 168 hours. BNP: BNP (last 3 results) No results for input(s): BNP in the last 8760 hours.  ProBNP (last 3 results) No results  for input(s): PROBNP in the last 8760 hours.  CBG: No results for input(s): GLUCAP in the last 168 hours.     Signed:  Desma Maxim MD.  [facility] 09/12/2020, 9:59 AM

## 2020-09-12 NOTE — Progress Notes (Signed)
Patient reports feeling she is having a stroke.  NIH assessment complete at baseline of 2.  Patient has repetitive speech and is very drowsy.  Prior to the incident patient was sleeping sound until VS were assessed and she was awoke.  Dr. Damita Dunnings notified and stat head CT ordered.

## 2020-09-12 NOTE — Evaluation (Signed)
Occupational Therapy Evaluation Patient Details Name: Tiffany Ramirez MRN: [id number] DOB: May 02, 1928 Today's Date: 09/12/2020    History of Present Illness 87 y.o. female who was admitted for possible syncopal episode. Pt endorses L hip and shoulder pain following episode however imaging was negative for any fracture. PMH: atrial fibrillation, Parkinson's disease, depression, hypertension.   Clinical Impression   Pt seen for OT evaluation this date. Prior to admission, pt was mod-independent with rollator for functional mobility and ADLs (during optimal window of PD medication), living in a wheelchair-accessible home with husband. Pt reports that she requires increased assist throughout day as PD medication weans and that a personal care aide assists 1x/week (pt/family plans on increasing frequency following this admission). Pt currently requires MIN GUARD for seated LB dressing, MIN GUARD for stand pivot transfer to/from [facility], and SUPERVISION for functional mobility of household distances (~147f) with RW d/t decreased activity tolerance and decreased balance. Daughter instructed on proper caregiver positioning for assisting pt with transfers [facility] Pt would benefit from additional skilled OT services to maximize return to PLOF and minimize risk of future falls, injury, caregiver burden, and readmission. Upon discharge, [facility]        Follow Up Recommendations  Home health OT    Equipment Recommendations  None recommended by OT       Precautions / Restrictions Precautions Precautions: Fall Restrictions Weight Bearing Restrictions: No      Mobility Bed Mobility Overal bed mobility: Needs Assistance Bed Mobility: Supine to Sit;Sit to Supine     Supine to sit: Supervision;HOB elevated Sit to supine: Supervision;HOB elevated   General bed mobility comments: Able to perform with ease, requires MIN verbal cues for positioning    Transfers Overall transfer  level: Needs assistance Equipment used: Rolling walker (2 wheeled) Transfers: Sit to/from [facility] to Stand: Min guard Stand pivot transfers: Min guard            Balance Overall balance assessment: Needs assistance Sitting-balance support: No upper extremity supported;Feet supported Sitting balance-Leahy Scale: Good Sitting balance - Comments: good sitting balance at EOB reaching within BOS   Standing balance support: Bilateral upper extremity supported;During functional activity Standing balance-Leahy Scale: Fair Standing balance comment: With b/l UE support from RW, pt able to walk 100 ft with SUPERVISION only                           ADL either performed or assessed with clinical judgement   ADL Overall ADL's : Needs assistance/impaired                     Lower Body Dressing: Min guard;Sitting/lateral leans Lower Body Dressing Details (indicate cue type and reason): to don socks while seated EOB Toilet Transfer: Min guard;Stand-pivot;BSC           Functional mobility during ADLs: Supervision/safety;Rolling walker        Pertinent Vitals/Pain Pain Assessment: No/denies pain     Extremity/Trunk Assessment Upper Extremity Assessment Upper Extremity Assessment: Overall WFL for tasks assessed;LUE deficits/detail LUE Deficits / Details: Pt endorses feeling weaker on L side, however strength appearing symmetrial and grossly 4-/5 in all movements   Lower Extremity Assessment Lower Extremity Assessment: Generalized weakness;Defer to PT evaluation       Communication Communication Communication: No difficulties   Cognition Arousal/Alertness: Awake/alert Behavior During Therapy: WFL for tasks assessed/performed Overall Cognitive Status: Within Functional Limits for tasks assessed  General Comments  HR 100-120s during functional mobility. HR 80s at rest    Exercises  Other Exercises Other Exercises: Education on caregiver positioning for transfers to/from [facility], with pt's daughter verbalizing understanding        Home Living Family/patient expects to be discharged to:: Private residence Living Arrangements: Spouse/significant other Available Help at Discharge: Family;Available 24 hours/day;Personal care attendant (Daughters live close and able to assist PRN. PCA comes 1x/week, but pt states that they are increasing frequency following admission) Type of Home: House             Bathroom Shower/Tub: Walk-in shower     Bathroom Accessibility: Yes How Accessible: Accessible via walker Home Equipment: [place] - 4 wheels;Bedside commode;Tub bench;Hand held shower head;Grab bars - tub/shower;Other (comment)   Additional Comments: Pt sleeps in sit>stand recliner      Prior Functioning/Environment Level of Independence: Needs assistance  Gait / Transfers Assistance Needed: Pt uses rollator for functional mobility of household distances. Pt reports that husband assists with BSC transfers in middle of night ADL's / Homemaking Assistance Needed: Pt reports that she is independent with ADLs in AM (in combination with PD medication). Requires increased assist throughout day as PD medication weans            OT Problem List: Decreased strength;Decreased activity tolerance;Impaired balance (sitting and/or standing)      OT Treatment/Interventions: Self-care/ADL training;Therapeutic exercise;Energy conservation;DME and/or AE instruction;Patient/family education;Balance training;Therapeutic activities    OT Goals(Current goals can be found in the care plan section) Acute Rehab OT Goals Patient Stated Goal: to be safer during BSC at night OT Goal Formulation: With patient/family Time For Goal Achievement: 09/26/20 Potential to Achieve Goals: Good ADL Goals Pt Will Perform Grooming: with set-up;with supervision;standing Pt Will Transfer to Toilet: with  supervision;ambulating;regular height toilet Pt Will Perform Toileting - Clothing Manipulation and hygiene: with supervision;sitting/lateral leans  OT Frequency: Min 1X/week    AM-PAC OT "6 Clicks" Daily Activity     Outcome Measure Help from another person eating meals?: None Help from another person taking care of personal grooming?: A Little Help from another person toileting, which includes using toliet, bedpan, or urinal?: A Little Help from another person bathing (including washing, rinsing, drying)?: A Little Help from another person to put on and taking off regular upper body clothing?: A Little Help from another person to put on and taking off regular lower body clothing?: A Little 6 Click Score: 19   End of Session Equipment Utilized During Treatment: Gait belt;Rolling walker Nurse Communication: Mobility status  Activity Tolerance: Patient tolerated treatment well Patient left: in bed;with call bell/phone within reach;with bed alarm set;with family/visitor present  OT Visit Diagnosis: Unsteadiness on feet (R26.81);Muscle weakness (generalized) (M62.81)                Time: [id number] OT Time Calculation (min): 28 min Charges:  OT General Charges $OT Visit: 1 Visit OT Evaluation $OT Eval Moderate Complexity: 1 Mod OT Treatments $Self Care/Home Management : 8-22 mins  Fredirick Maudlin, OTR/L [place]

## 2020-09-12 NOTE — Progress Notes (Signed)
[facility] Cardiology  SUBJECTIVE: Patient laying in bed, reports feeling well   Vitals:   09/11/20 1938 09/12/20 0400 09/12/20 0500 09/12/20 0757  BP: (!) 152/78 140/65  (!) 114/54  Pulse: (!) 57 63  73  Resp: '18 16  17  '$ Temp: 97.7 F (36.5 C) 97.8 F (36.6 C)  (!) 97.4 F (36.3 C)  TempSrc:  Axillary  Oral  SpO2: 100% 100%  100%  Weight:   49.6 kg   Height:         Intake/Output Summary (Last 24 hours) at 09/12/2020 1105 Last data filed at 09/11/2020 1654 Gross per 24 hour  Intake 240 ml  Output --  Net 240 ml      PHYSICAL EXAM  General: Well developed, well nourished, in no acute distress HEENT:  Normocephalic and atramatic Neck:  No JVD.  Lungs: Clear bilaterally to auscultation and percussion. Heart: HRRR . Normal S1 and S2 without gallops or murmurs.  Abdomen: Bowel sounds are positive, abdomen soft and non-tender  Msk:  Back normal, normal gait. Normal strength and tone for age. Extremities: No clubbing, cyanosis or edema.   Neuro: Alert and oriented X 3. Psych:  Good affect, responds appropriately   LABS: Basic Metabolic Panel: Recent Labs    09/11/20 0331 09/12/20 0538  NA 136 137  K 3.2* 3.4*  CL 99 100  CO2 28 25  GLUCOSE 73 102*  BUN 24* 19  CREATININE 0.78 0.57  CALCIUM 8.6* 9.0  MG  --  2.1   Liver Function Tests: Recent Labs    09/11/20 0331  AST 15  ALT <5  ALKPHOS 70  BILITOT 1.0  PROT 6.3*  ALBUMIN 3.9   No results for input(s): LIPASE, AMYLASE in the last 72 hours. CBC: Recent Labs    09/10/20 1648 09/11/20 0331  WBC 5.2 4.4  NEUTROABS  --  2.8  HGB 10.1* 10.1*  HCT 31.7* 32.5*  MCV 79.3* 79.3*  PLT 199 199   Cardiac Enzymes: No results for input(s): CKTOTAL, CKMB, CKMBINDEX, TROPONINI in the last 72 hours. BNP: Invalid input(s): POCBNP D-Dimer: No results for input(s): DDIMER in the last 72 hours. Hemoglobin A1C: Recent Labs    09/12/20 0538  HGBA1C 5.7*   Fasting Lipid Panel: Recent Labs    09/12/20 0538   CHOL 124  HDL 64  LDLCALC 48  TRIG 62  CHOLHDL 1.9   Thyroid Function Tests: Recent Labs    09/11/20 0537  TSH 2.295   Anemia Panel: No results for input(s): VITAMINB12, FOLATE, FERRITIN, TIBC, IRON, RETICCTPCT in the last 72 hours.  DG Lumbar Spine Complete  Result Date: 09/11/2020 CLINICAL DATA:  Fall and back pain. EXAM: LUMBAR SPINE - COMPLETE 4+ VIEW COMPARISON:  CT abdomen pelvis dated 07/29/2018 FINDINGS: Five lumbar type vertebra. There is no acute fracture or subluxation of the lumbar spine. There is osteopenia with multilevel degenerative changes there is advanced atherosclerotic calcification of the aorta. Contrast mixed with stool noted in the colon and outlining severe sigmoid diverticulosis. IMPRESSION: 1. No acute/traumatic lumbar spine pathology. 2. Sigmoid diverticulosis. Electronically Signed   By: Anner Crete M.D.   On: 09/11/2020 03:39   CT HEAD WO CONTRAST (5MM)  Result Date: 09/12/2020 CLINICAL DATA:  Neuro deficit with acute stroke suspected EXAM: CT HEAD WITHOUT CONTRAST TECHNIQUE: Contiguous axial images were obtained from the base of the skull through the vertex without intravenous contrast. COMPARISON:  Head CT and brain MRI from yesterday FINDINGS: Brain: No evidence of  acute infarction, hemorrhage, hydrocephalus, extra-axial collection or mass lesion/mass effect. Chronic small vessel ischemia that is confluent in the deep white matter. Age normal brain volume. Vascular: No hyperdense vessel or unexpected calcification. Skull: Normal. Negative for fracture or focal lesion. Sinuses/Orbits: No acute finding. IMPRESSION: 1. No new or acute finding. 2. Chronic small vessel ischemia. Electronically Signed   By: Monte Fantasia M.D.   On: 09/12/2020 04:51   CT Head Wo Contrast  Result Date: 09/11/2020 CLINICAL DATA:  Simple syncope with abnormal neuro exam.  Weakness. EXAM: CT HEAD WITHOUT CONTRAST TECHNIQUE: Contiguous axial images were obtained from the base of  the skull through the vertex without intravenous contrast. COMPARISON:  None available FINDINGS: Brain: No evidence of acute infarction, hemorrhage, hydrocephalus, extra-axial collection or mass lesion/mass effect. Well preserved brain volume for age. Chronic small vessel ischemia in the hemispheric white matter. Vascular: No hyperdense vessel or unexpected calcification. 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 Sinuses/Orbits: Negative IMPRESSION: No acute or reversible finding. Electronically Signed   By: Monte Fantasia M.D.   On: 09/11/2020 04:15   MR BRAIN WO CONTRAST  Result Date: 09/11/2020 CLINICAL DATA:  Neuro deficit, acute, stroke suspected; leg weakness EXAM: MRI HEAD WITHOUT CONTRAST TECHNIQUE: Multiplanar, multiecho pulse sequences of the brain and surrounding structures were obtained without intravenous contrast. COMPARISON:  None. FINDINGS: Brain: There is no acute infarction or intracranial hemorrhage. There is no intracranial mass, mass effect, or edema. There is no hydrocephalus or extra-axial fluid collection. Prominence of the ventricles and sulci reflects generalized parenchymal volume loss. Patchy and confluent areas of T2 hyperintensity in the supratentorial greater than pontine white matter are nonspecific but probably reflect moderate chronic microvascular ischemic changes. There is a small chronic infarct of the left thalamus. Vascular: Major vessel flow voids at the skull base are preserved. Skull and upper cervical spine: Normal marrow signal is preserved. Sinuses/Orbits: Paranasal sinuses are aerated. Orbits are unremarkable. Other: Sella is unremarkable.  Mastoid air cells are clear. IMPRESSION: No evidence of recent infarction, hemorrhage, or mass. Moderate chronic microvascular ischemic changes. Small chronic left thalamic infarct. Electronically Signed   By: Macy Mis M.D.   On: 09/11/2020 11:24   US Carotid Bilateral  Result Date:  09/11/2020 CLINICAL DATA:  Syncope EXAM: BILATERAL CAROTID DUPLEX ULTRASOUND TECHNIQUE: Pearline Cables scale imaging, color Doppler and duplex ultrasound were performed of bilateral carotid and vertebral arteries in the neck. COMPARISON:  None. FINDINGS: Criteria: Quantification of carotid stenosis is based on velocity parameters that correlate the residual internal carotid diameter with NASCET-based stenosis levels, using the diameter of the distal internal carotid lumen as the denominator for stenosis measurement. The following velocity measurements were obtained: RIGHT ICA: 71/20 cm/sec CCA: [id number] cm/sec SYSTOLIC ICA/CCA RATIO:  1.2 ECA:  67 cm/sec LEFT ICA: 77/23 cm/sec CCA: 0000000 cm/sec SYSTOLIC ICA/CCA RATIO:  1.2 ECA:  110 cm/sec RIGHT CAROTID ARTERY: Trace smooth heterogeneous atherosclerotic plaque in the proximal internal carotid artery. By peak systolic velocity criteria, the estimated stenosis is less than 50%. RIGHT VERTEBRAL ARTERY:  Patent with normal antegrade flow. LEFT CAROTID ARTERY: Trace smooth heterogeneous atherosclerotic plaque in the proximal internal carotid artery. By peak systolic velocity criteria, the estimated stenosis is less than 50%. LEFT VERTEBRAL ARTERY:  Patent with normal antegrade flow. IMPRESSION: 1. Mild (1-49%) stenosis proximal right internal carotid artery secondary to trace smooth heterogeneous atherosclerotic plaque. 2. Mild (1-49%) stenosis proximal left internal carotid artery secondary to trace smooth heterogeneous atherosclerotic plaque. 3.  Both vertebral arteries are patent with normal antegrade flow. Signed, Criselda Peaches, MD, [facility] Electronically Signed   By: Jacqulynn Cadet M.D.   On: 09/11/2020 07:18   DG Chest Portable 1 View  Result Date: 09/10/2020 CLINICAL DATA:  Syncope.  Fall from home. EXAM: PORTABLE CHEST 1 VIEW COMPARISON:  CT 05/14/2018 FINDINGS: Heart size and pulmonary  vascularity are normal. Emphysematous changes in the lungs. Peribronchial thickening and bronchiectasis. No airspace disease or consolidation. No pleural effusions. No pneumothorax. Mediastinal contours appear intact. Calcified and tortuous aorta. Moderate-sized esophageal hiatal hernia behind the heart. IMPRESSION: Emphysematous and chronic bronchitic changes in the lungs. No evidence of active pulmonary disease. Electronically Signed   By: Lucienne Capers M.D.   On: 09/10/2020 18:29   ECHOCARDIOGRAM COMPLETE  Result Date: 09/11/2020    ECHOCARDIOGRAM REPORT   Patient Name:   Tiffany Ramirez Date of Exam: 09/11/2020 Medical Rec #:  [id number]         Height:       66.0 in Accession #:    [id number]        Weight:       105.8 lb Date of Birth:  March 19, 1928         BSA:          1.526 m Patient Age:    87 years          BP:           142/73 mmHg Patient Gender: F                 HR:           63 bpm. Exam Location:  [facility] Procedure: 2D Echo and Strain Analysis Indications:     Syncope R55  History:         Patient has no prior history of Echocardiogram examinations.  Sonographer:     Kathlen Brunswick RDCS Referring Phys:  [id number] Athena Masse Diagnosing Phys: Isaias Cowman MD  Sonographer Comments: Global longitudinal strain was attempted. IMPRESSIONS  1. Left ventricular ejection fraction, by estimation, is 55 to 60%. The left ventricle has normal function. The left ventricle has no regional wall motion abnormalities. Left ventricular diastolic parameters are consistent with Grade I diastolic dysfunction (impaired relaxation).  2. Right ventricular systolic function is normal. The right ventricular size is normal.  3. The mitral valve is normal in structure. Mild mitral valve regurgitation. No evidence of mitral stenosis.  4. The aortic valve is normal in structure. Aortic valve regurgitation is not visualized. No aortic stenosis is present.  5. The inferior vena cava is normal in size with greater than  50% respiratory variability, suggesting right atrial pressure of 3 mmHg. FINDINGS  Left Ventricle: Left ventricular ejection fraction, by estimation, is 55 to 60%. The left ventricle has normal function. The left ventricle has no regional wall motion abnormalities. The left ventricular internal cavity size was normal in size. There is  no left ventricular hypertrophy. Left ventricular diastolic parameters are consistent with Grade I diastolic dysfunction (impaired relaxation). Right Ventricle: The right ventricular size is normal. No increase in right ventricular wall thickness. Right ventricular systolic function is normal. Left Atrium: Left atrial size was normal in size. Right Atrium: Right atrial size was normal in size. Pericardium: There is no evidence of pericardial effusion. Mitral Valve: The mitral valve is normal in structure. Mild mitral valve regurgitation. No evidence of mitral valve stenosis. MV peak  gradient, 12.7 mmHg. The mean mitral valve gradient is 4.0 mmHg. Tricuspid Valve: The tricuspid valve is normal in structure. Tricuspid valve regurgitation is mild . No evidence of tricuspid stenosis. Aortic Valve: The aortic valve is normal in structure. Aortic valve regurgitation is not visualized. No aortic stenosis is present. Pulmonic Valve: The pulmonic valve was normal in structure. Pulmonic valve regurgitation is not visualized. No evidence of pulmonic stenosis. Aorta: The aortic root is normal in size and structure. Venous: The inferior vena cava is normal in size with greater than 50% respiratory variability, suggesting right atrial pressure of 3 mmHg. IAS/Shunts: No atrial level shunt detected by color flow Doppler.  LEFT VENTRICLE PLAX 2D LVIDd:         2.99 cm  Diastology LVIDs:         2.10 cm  LV e' medial:    4.03 cm/s LV PW:         0.94 cm  LV E/e' medial:  23.9 LV IVS:        0.94 cm  LV e' lateral:   3.05 cm/s LVOT diam:     1.80 cm  LV E/e' lateral: 31.6 LV SV:         68 LV SV Index:    45 LVOT Area:     2.54 cm  RIGHT VENTRICLE RV Basal diam:  3.27 cm RV S prime:     13.80 cm/s TAPSE (M-mode): 2.1 cm LEFT ATRIUM             Index       RIGHT ATRIUM           Index LA diam:        4.30 cm 2.82 cm/m  RA Area:     10.20 cm LA Vol (A2C):   60.9 ml 39.91 ml/m RA Volume:   21.10 ml  13.83 ml/m LA Vol (A4C):   45.2 ml 29.62 ml/m LA Biplane Vol: 53.4 ml 35.00 ml/m  AORTIC VALVE             PULMONIC VALVE LVOT Vmax:   118.00 cm/s PV Vmax:       0.90 m/s LVOT Vmean:  71.800 cm/s PV Peak grad:  3.2 mmHg LVOT VTI:    0.267 m  AORTA Ao Root diam: 2.80 cm Ao Asc diam:  2.50 cm MITRAL VALVE                TRICUSPID VALVE MV Area (PHT): 1.79 cm     TV Peak grad:   23.9 mmHg MV Area VTI:   1.44 cm     TV Vmax:        2.45 m/s MV Peak grad:  12.7 mmHg MV Mean grad:  4.0 mmHg     SHUNTS MV Vmax:       1.78 m/s     Systemic VTI:  0.27 m MV Vmean:      91.9 cm/s    Systemic Diam: 1.80 cm MV Decel Time: 424 msec MV E velocity: 96.50 cm/s MV A velocity: 139.00 cm/s MV E/A ratio:  0.69 Isaias Cowman MD Electronically signed by Isaias Cowman MD Signature Date/Time: 09/11/2020/12:42:45 PM    Final    DG Hip Unilat With Pelvis 2-3 Views Left  Result Date: 09/10/2020 CLINICAL DATA:  Fall and left hip pain. EXAM: DG HIP (WITH OR WITHOUT PELVIS) 2-3V LEFT COMPARISON:  None. FINDINGS: There is no acute fracture or dislocation. The bones are osteopenic. Mild bilateral hip arthritic  changes. Dense stool throughout the distal colon outlining severe diverticulosis. The soft tissues are unremarkable. IMPRESSION: No acute fracture or dislocation. Electronically Signed   By: Anner Crete M.D.   On: 09/10/2020 19:33     Echo LVEF 55 to 60%  TELEMETRY: Sinus bradycardia 56 bpm:  ASSESSMENT AND PLAN:  Principal Problem:   Syncope Active Problems:   Essential hypertension   Paroxysmal atrial fibrillation (HCC)   Weakness    1.  Syncope, with possible brief loss of consciousness, and patient  with history of paroxysmal atrial fibrillation, ECG revealing sinus bradycardia at 54 bpm, with history of Parkinson's.  Head CT unremarkable, carotid ultrasound reveals less than 50% stenosis bilaterally, brain MRI did not show evidence of recent infarction, 2D echocardiogram revealed normal left ventricular function without significant valvular abnormalities.  Etiology likely due to Parkinson's. 2.  Sinus bradycardia, currently hemodynamically stable 3.  Paroxysmal atrial fibrillation, not on chronic anticoagulation due to advanced age, frailty, prone to falling due to Parkinson's 4.  Parkinson's 5.  Essential hypertension, blood pressure in normal range on low-dose hydrochlorothiazide   Recommendations   1.  Agree with current therapy 2.  Agree with discharge home 3.  Follow-up in 1 to 2 weeks at which time we will likely place 30-day Holter monitor and readdress need for chronic anticoagulation for stroke prevention with CHA2DS2-VASc score of 4   Isaias Cowman, MD, PhD, [facility] 09/12/2020 11:05 AM

## 2020-09-14 ENCOUNTER — Telehealth: Payer: Self-pay | Admitting: Nurse Practitioner

## 2020-09-14 NOTE — Telephone Encounter (Signed)
Spoke with patient's daughter, Manuela Neptune, regarding the Palliative referral/services and she was in agreement with scheduling visit.  I have scheduled an In-home Consult for 09/23/20 @ 9 AM.

## 2020-09-16 ENCOUNTER — Emergency Department: Payer: Medicare Other

## 2020-09-16 ENCOUNTER — Emergency Department
Admission: EM | Admit: 2020-09-16 | Discharge: 2020-09-16 | Disposition: A | Discharge status: Home / Self Care | Payer: Medicare Other | Attending: Student in an Organized Health Care Education/Training Program | Admitting: Student in an Organized Health Care Education/Training Program

## 2020-09-16 ENCOUNTER — Other Ambulatory Visit: Payer: Self-pay

## 2020-09-16 DIAGNOSIS — Y92002 Bathroom of unspecified non-institutional (private) residence single-family (private) house as the place of occurrence of the external cause: Secondary | ICD-10-CM | POA: Insufficient documentation

## 2020-09-16 DIAGNOSIS — E119 Type 2 diabetes mellitus without complications: Secondary | ICD-10-CM | POA: Diagnosis not present

## 2020-09-16 DIAGNOSIS — Z79899 Other long term (current) drug therapy: Secondary | ICD-10-CM | POA: Diagnosis not present

## 2020-09-16 DIAGNOSIS — M25552 Pain in left hip: Secondary | ICD-10-CM | POA: Insufficient documentation

## 2020-09-16 DIAGNOSIS — S00532A Contusion of oral cavity, initial encounter: Secondary | ICD-10-CM | POA: Diagnosis not present

## 2020-09-16 DIAGNOSIS — Z7902 Long term (current) use of antithrombotics/antiplatelets: Secondary | ICD-10-CM | POA: Insufficient documentation

## 2020-09-16 DIAGNOSIS — W19XXXA Unspecified fall, initial encounter: Secondary | ICD-10-CM

## 2020-09-16 DIAGNOSIS — W1830XA Fall on same level, unspecified, initial encounter: Secondary | ICD-10-CM | POA: Diagnosis not present

## 2020-09-16 DIAGNOSIS — Z85828 Personal history of other malignant neoplasm of skin: Secondary | ICD-10-CM | POA: Insufficient documentation

## 2020-09-16 DIAGNOSIS — Z87891 Personal history of nicotine dependence: Secondary | ICD-10-CM | POA: Diagnosis not present

## 2020-09-16 DIAGNOSIS — S0990XA Unspecified injury of head, initial encounter: Secondary | ICD-10-CM | POA: Diagnosis present

## 2020-09-16 DIAGNOSIS — I10 Essential (primary) hypertension: Secondary | ICD-10-CM | POA: Diagnosis not present

## 2020-09-16 NOTE — ED Triage Notes (Signed)
Pt to ER via [facility] after having a fall from standing while brushing her teeth this morning. Denies LOC or blood thinner usage. Reports possibly hitting her head. Pt has Parkinson's, reports turning to use the bathroom and turned too quickly losing her balance.. Reports left hip pain, was previously seen for hip pain after falling 8/20 and x-rays were negative.

## 2020-09-16 NOTE — ED Provider Notes (Signed)
[facility] Emergency Department Provider Note  ____________________________________________   Event Date/Time   First MD Initiated Contact with Patient 09/16/20 1051     (approximate)  I have reviewed the triage vital signs and the nursing notes.   HISTORY  Chief Complaint Fall  HPI Tiffany Ramirez is a 87 y.o. female who presents to the emergency department for evaluation after a fall.  Patient states that she was brushing her teeth, had to quickly use the bathroom and tried to rush to the toilet but lost her balance and fell, landing on her left hip and shoulder.  According to the patient and family, this is the third fall in 1 week and reportedly she already had some left hip pain prior to this fall from previous fall within the week.  She does endorse hitting her head, denies loss of consciousness.  She denies any preceding chest pain, weakness, presyncope.  She states that she simply lost her balance due to Parkinson's.  After her fall, she was able to relatively quickly get assistance up, has continued to be ambulatory in the few hours since the fall.  She already had a PCP visit scheduled, and went to that appointment but was referred here after learning that she hit her head.  She has described some pain on the left temple, denies any other associated symptoms, denies blurry vision, headache, neck pain.  No treatment was attempted prior to arrival.  She is continue to be ambulatory with use of walker.         Past Medical History:  Diagnosis Date   Anxiety    Borderline diabetic    08-2014   Depression    Diabetes mellitus without complication ([place])    Insomnia with sleep apnea, unspecified    Other and unspecified hyperlipidemia    Parkinson's disease ([place])    Skin cancer, basal cell    Sleep disorder    Unspecified essential hypertension     Patient Active Problem List   Diagnosis Date Noted   Syncope 09/11/2020   Weakness 09/11/2020    Lymphedema 04/04/2020   Chronic venous insufficiency 04/04/2020   Atypical chest pain 06/14/2018   Paroxysmal atrial fibrillation ([place]) 06/14/2018   Sinus bradycardia 06/13/2018   Iron malabsorption 05/08/2018   Iron deficiency anemia due to chronic blood loss 05/07/2018   Shortness of breath 05/07/2018   Type 2 diabetes mellitus with peripheral angiopathy ([place]) 08/22/2017   Medicare annual wellness visit, initial 02/15/2016   Swelling of limb 12/28/2015   Chronic hyperglycemia 08/13/2014   Paralysis agitans ([place]) 08/28/2013   Hyperlipidemia, mixed 07/28/2013   Arterial vascular disease 07/28/2013   Insomnia with sleep apnea    Sleep disorder    Essential hypertension     Past Surgical History:  Procedure Laterality Date   CATARACT EXTRACTION Bilateral     Prior to Admission medications   Medication Sig Start Date End Date Taking? Authorizing Provider  alum & mag hydroxide-simeth (MAALOX MAX) 400-400-40 MG/5ML suspension Take 5 mLs by mouth every 6 (six) hours as needed for indigestion. 08/12/18   Rudene Re, MD  carbidopa-levodopa (SINEMET IR) 25-100 MG tablet Take 1 tablet by mouth 6 (six) times daily. At 7, 10, 1 PM, 4 PM, 7 PM, and 10 PM. 02/18/18   Star Age, MD  clopidogrel (PLAVIX) 75 MG tablet Take 75 mg by mouth daily.    [provider]  dexlansoprazole (DEXILANT) 60 MG capsule Take by mouth. 03/16/20 09/11/20  [provider]  famotidine (PEPCID) 40 MG tablet Take 40 mg by mouth daily. 03/21/20   [provider]  gabapentin (NEURONTIN) 300 MG capsule  03/21/20   [provider]  hydrochlorothiazide (HYDRODIURIL) 12.5 MG tablet Take 12.5 mg by mouth daily. 07/18/18   [provider]  INBRIJA 42 MG CAPS SMARTSIG:Capsule(s) Via Inhaler 05/13/20   [provider]  linaclotide Rolan Lipa) 145 MCG CAPS capsule Take 1 capsule (145 mcg total) by mouth daily before breakfast. 08/28/18   Star Age, MD  nitrofurantoin,  macrocrystal-monohydrate, (MACROBID) 100 MG capsule Take 100 mg by mouth 2 (two) times daily. 09/08/20   [provider]  ondansetron (ZOFRAN ODT) 4 MG disintegrating tablet Take 1 tablet (4 mg total) by mouth every 8 (eight) hours as needed. 08/12/18   Alfred Levins, [place], MD  ondansetron (ZOFRAN) 8 MG tablet TAKE 1 TABLET (8 MG TOTAL) BY MOUTH EVERY 4 (FOUR) HOURS AS NEEDED FOR NAUSEA FOR UP TO 20 DAYS 06/14/20   [provider]  pantoprazole (PROTONIX) 20 MG tablet Take 40 mg by mouth daily.     [provider]  potassium chloride (KLOR-CON) 10 MEQ tablet Take 1 tablet (10 mEq total) by mouth daily. 09/12/20   Wouk, Ailene Rud, MD  Probiotic Product (PROBIOTIC PO) Take by mouth.    [provider]  simvastatin (ZOCOR) 20 MG tablet Take 20 mg by mouth daily.     [provider]  sucralfate (CARAFATE) 1 GM/10ML suspension Take by mouth. 07/17/18 07/17/19  [provider]    Allergies Codeine, Phenobarbital, and Other  Family History  Problem Relation Age of Onset   Heart failure Mother    Hyperlipidemia Mother    Heart attack Father    Diabetes Father    Brain cancer Sister    Psychiatric Illness Sister    Stroke Sister    Deep vein thrombosis Sister    Lung cancer Brother     Social History Social History   Tobacco Use   Smoking status: Never   Smokeless tobacco: Never  Substance Use Topics   Alcohol use: No   Drug use: No    Review of Systems Constitutional: No fever/chills Eyes: No visual changes. ENT: No sore throat. Cardiovascular: Denies chest pain. Respiratory: Denies shortness of breath. Gastrointestinal: No abdominal pain.  No nausea, no vomiting.  No diarrhea.  No constipation. Genitourinary: Negative for dysuria. Musculoskeletal: + Left hip pain, negative for back pain. Skin: Negative for rash. Neurological: + Minor head trauma, negative for headaches, focal weakness or  numbness.  ____________________________________________   PHYSICAL EXAM:  VITAL SIGNS: ED Triage Vitals [09/16/20 1014]  Enc Vitals Group     BP (!) 147/66     Pulse Rate 78     Resp 18     Temp 97.7 F (36.5 C)     Temp Source Oral     SpO2 98 %     Weight 109 lb 5.6 oz (49.6 kg)     Height '5\' 6"'$  (1.676 m)     Head Circumference      Peak Flow      Pain Score 0     Pain Loc      Pain Edu?      Excl. in [place]?    Constitutional: Alert and oriented. Well appearing and in no acute distress. Eyes: Conjunctivae are normal. PERRL. EOMI. Head: Atraumatic.  Mild tenderness noted initially over the left temple, though this is not reproducible when returning to  this region.  No tenderness to the rest of the facial structures.  No facial soft tissue swelling, ecchymosis or other evidence of acute trauma. Nose: No congestion/rhinnorhea. Mouth/Throat: Mucous membranes are moist.  Oropharynx non-erythematous. Neck: No stridor.  No tenderness to palpation of the cervical spine. Cardiovascular: Normal rate, regular rhythm. Grossly normal heart sounds.   Respiratory: Normal respiratory effort.  No retractions.  Gastrointestinal: Gait belt in place for assistance.  Soft and nontender. No distention. No abdominal bruits. No CVA tenderness. Musculoskeletal: There is tenderness over the posterior left hip, endorsed by patient and family member as being persistent since previous fall.  No tenderness to the lateral or anterior left hip, negative pelvic squeeze, negative logroll bilaterally.  Symmetric and equal dorsal pedal pulses.  No tenderness to palpation of the lumbar spine. Neurologic:  Normal speech and language. No gross focal neurologic deficits are appreciated.  Skin:  Skin is warm, dry and intact. No rash noted. Psychiatric: Mood and affect are normal. Speech and behavior are normal.  ____________________________________________  RADIOLOGY  Official radiology report(s): CT HEAD WO  CONTRAST (5MM)  Result Date: 09/16/2020 CLINICAL DATA:  87 year old female status post fall. EXAM: CT HEAD WITHOUT CONTRAST TECHNIQUE: Contiguous axial images were obtained from the base of the skull through the vertex without intravenous contrast. COMPARISON:  Brain MRI 09/11/2020.  Head CT 09/12/2020. FINDINGS: Brain: No midline shift, ventriculomegaly, mass effect, evidence of mass lesion, intracranial hemorrhage or evidence of cortically based acute infarction. Stable Patchy and confluent bilateral white matter hypodensity. Vascular: Calcified atherosclerosis at the skull base. No suspicious intracranial vascular hyperdensity. Skull: Stable, intact. Sinuses/Orbits: Visualized paranasal sinuses and mastoids are stable and well aerated. Other: No acute orbit or scalp soft tissue finding. IMPRESSION: Stable. No acute intracranial abnormality or acute traumatic injury identified. Electronically Signed   By: Genevie Ann M.D.   On: 09/16/2020 11:34   CT Cervical Spine Wo Contrast  Result Date: 09/16/2020 CLINICAL DATA:  87 year old female status post fall. EXAM: CT CERVICAL SPINE WITHOUT CONTRAST TECHNIQUE: Multidetector CT imaging of the cervical spine was performed without intravenous contrast. Multiplanar CT image reconstructions were also generated. COMPARISON:  Head CT today. Brain MRI 09/11/2020. chest CTA 05/14/2018. FINDINGS: Alignment: Mildly exaggerated upper cervical lordosis. Cervicothoracic junction alignment is within normal limits. Bilateral posterior element alignment is within normal limits. Skull base and vertebrae: Visualized skull base is intact. No atlanto-occipital dissociation. C1 and C2 appear intact and aligned. Osteopenia. No acute osseous abnormality identified. Soft tissues and spinal canal: No prevertebral fluid or swelling. No visible canal hematoma. Partially retropharyngeal course of the right carotid. Mild for age calcified carotid atherosclerosis. 3.2 cm heterogeneous right thyroid  nodule, stable since 2020 and In the setting of significant comorbidities or limited life expectancy, no follow-up recommended (ref: J Am Coll Radiol. 2015 Feb;12(2): 143-50). 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 Capacious cervical spinal canal. Multilevel left side facet arthropathy. Upper chest: Grossly intact visible upper thoracic levels. Mild apical lung scarring. IMPRESSION: 1. No acute traumatic injury identified in the cervical spine. 2. 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 Electronically Signed   By: Genevie Ann M.D.   On: 09/16/2020 11:39   DG Hip Unilat With Pelvis 2-3 Views Left  Result Date: 09/16/2020 CLINICAL DATA:  Fall, hip pain EXAM: DG HIP (WITH OR WITHOUT PELVIS) 2-3V LEFT COMPARISON:  09/10/2020 FINDINGS: No acute fracture or dislocation of the left hip. Bony pelvis intact without diastasis. Residual contrast within numerous colonic diverticula. IMPRESSION: No  acute fracture or dislocation of the left hip. Electronically Signed   By: Davina Poke D.O.   On: 09/16/2020 11:08     ____________________________________________   INITIAL IMPRESSION / ASSESSMENT AND PLAN / ED COURSE  As part of my medical decision making, I reviewed the following data within the [place] notes reviewed and incorporated, Old chart reviewed, Radiograph reviewed, and Notes from prior ED visits        Patient is a 87 year old female who presents to the emergency department for evaluation following mechanical fall in her bathroom.  She was not using her walker as usual given location, and states that she tried to move too quickly, losing her balance.  She denies any preceding or post fall associated symptoms including no syncope, chest pain, new shortness of breath, or other symptoms.  She is endorsing left hip pain that has persisted since previous fall in the week, but that she fell on the same area again.  She has been ambulatory since the fall without  significant difficulty and weightbearing.  See HPI for further details.  In triage, patient is mildly hypertensive at 147/66, otherwise vitals are within normal limits.  Physical exam as above.  The patient notably does not have any gross neurologic deficits, has reproducible posterior hip pain on the left side, negative logroll and has been ambulatory.  X-rays of the hip were obtained and are negative for acute fracture, CT of the head and neck are negative for acute pathology.  At this time, feel the patient is stable for continued outpatient follow-up, of which she already has close providers established.  Return precautions were discussed with patient and family member in the room.       ____________________________________________   FINAL CLINICAL IMPRESSION(S) / ED DIAGNOSES  Final diagnoses:  Fall, initial encounter  Injury of head, initial encounter     ED Discharge Orders     None        Note:  This document was prepared using Dragon voice recognition software and may include unintentional dictation errors.    Marlana Salvage, PA 09/16/20 1151    Merlyn Lot, MD 09/16/20 2003

## 2020-09-16 NOTE — Discharge Instructions (Addendum)
Please continue to use your walker with ambulation to reduce risk of falls.  Return to the emergency department if you experience any worsening or changes in symptoms, otherwise follow-up outpatient with your primary care provider.

## 2020-09-23 ENCOUNTER — Other Ambulatory Visit: Payer: Medicare Other | Admitting: Nurse Practitioner

## 2020-09-23 ENCOUNTER — Encounter: Payer: Self-pay | Admitting: Internal Medicine

## 2020-09-23 ENCOUNTER — Other Ambulatory Visit: Payer: Self-pay

## 2020-09-23 ENCOUNTER — Encounter: Payer: Self-pay | Admitting: Nurse Practitioner

## 2020-09-23 DIAGNOSIS — Z515 Encounter for palliative care: Secondary | ICD-10-CM

## 2020-09-23 DIAGNOSIS — R630 Anorexia: Secondary | ICD-10-CM

## 2020-09-23 DIAGNOSIS — R5381 Other malaise: Secondary | ICD-10-CM

## 2020-09-23 NOTE — Progress Notes (Signed)
[facility] Consult Note Telephone: [phone]  Fax: [phone]   Date of encounter: 09/23/20 2:52 PM PATIENT NAME: Tiffany Ramirez [address] [name] [address]   [phone] (home)  DOB: 02/25/1928 MRN: [id number] PRIMARY CARE PROVIDER:    Rusty Aus, MD,  [place] [id number] [phone] RESPONSIBLE PARTY:    Contact Information     Name Relation Home Work Tiffany Ramirez Daughter   [phone]   Tiffany Ramirez [phone]  [phone]   Tiffany Ramirez   [phone]      I met face to face with patient and family in home. Palliative Care was asked to follow this patient by consultation request of  Tiffany Aus, MD to address advance care planning and complex medical decision making. This is the initial visit.   ASSESSMENT AND PLAN / RECOMMENDATIONS:  Symptom Management/Plan: 1. Advance Care Planning; DNR wishes are for comfort care at home 2. Debility severe secondary to Parkinson progression. Discussed revisiting hospice, Tiffany Ramirez and Tiffany Ramirez in agreement, fall precautions.  3. Anorexia significant weight loss secondary to progression of Parkinson, discussed nutrition, weights, continue with wishes for comfort care. 4. Goals of Care: Goals include to maximize quality of life and symptom management. Our advance care planning conversation included a discussion about:    The value and importance of advance care planning  Exploration of personal, cultural or spiritual beliefs that might influence medical decisions  Exploration of goals of care in the event of a sudden injury or illness  Identification and preparation of a healthcare agent  Review and updating or creation of an advance directive document. 5. Palliative care encounter; Palliative care encounter; Palliative medicine team will continue to support patient,  patient's family, and medical team. Visit consisted of counseling and education dealing with the complex and emotionally intense issues of symptom management and palliative care in the setting of serious and potentially life-threatening illness  I spent 80 minutes providing this consultation. More than 50% of the time in this consultation was spent in counseling and care coordination. PPS: 30% Chief Complaint: Initial palliative consult for complex medical decision making  HISTORY OF PRESENT ILLNESS:  Tiffany Ramirez is a 87 y.o. year old female  with multiple medical problems including parkinson, afib, HTN, PCM, depression. ED visit 09/10/2020 for left hip pain following a fall, skin tears with recent treatment of uti. Hospitalization 09/11/2020 to 09/12/2020 for syncope, weakness, difficulty moving a consequence of patient's advancing parkinson's disease. Seen in ED 09/16/2020  following a fall with head injury d/c home with negative imaging. I called Tiffany Ramirez to confirm initial PC visit, no answer, message left. Tiffany Ramirez PC visit with covid screening negative yesterday so proceeded with the visit. I visited Tiffany Ramirez in Ramirez home with Ramirez daughter Tiffany Ramirez. Tiffany Ramirez had Ramirez eyes closed but would open with verbal cues and to answer questions. We talked about purpose of pc visit. We talked about the last time Tiffany Ramirez was independent at home. Tiffany Ramirez about 5 weeks ago Tiffany Ramirez was cleaning closets, ambulatory, functional doing Ramirez own adl's, meals. We talked about pmh including extensively about Parkinson with decline. We talked about Ramirez current functional abilities, she is having to be lifted out of a chair with a gait belt to turn/pivot to bsc. Tiffany Ramirez requires assistance for bathing, dressing. Tiffany Ramirez does require to be fed as it  is difficult for Ramirez to feed herself. We talked about difficulty swallowing. Tiffany Ramirez 3 bites chopped food to  take medication and became choked. We talked about worsening dysphagia. We talked about nutrition, weight loss which has been significant. We talked about past several months and she has lost from 120 lbs in February to 102 lbs now, 18+lbs. We talked about medical goals of care, DNR in place in the home. We talked about Hospice AV visit. We talked about Hospice services under Medicare benefit, what services are provided. [name] Ramirez she has asked for Ramirez daughters to get Ramirez Hospice services as she knows she is declining, doing worse. Ms Ramirez talked about dying, (denies SI) with overall progressive decline in disease process of parkinson reflective of muscle wasting severe, severe functional changes 5 weeks ago ambulatory now requires lift, 5 weeks ago able to perform ADL's, now total dependence. We talked about significant weight loss. We talked about worsening dysphagia. We talked about quality of life, what wishes are. Tiffany Ramirez Ramirez she does not want to prolong the dying process, Tiffany Ramirez Ramirez she is not afraid of dying but concerned about burdening Ramirez family. Tiffany Ramirez Ramirez she is aware of the overall decline and knows it will not be long before she passes. We talked about speciality appointments and the severe debility, difficulty getting to appointments and the physical tole it takes on Tiffany Ramirez. We talked about Hospice AV visit. We talked about why the choice was made not to sign for Hospice services. Tiffany Ramirez the Hospice RN told them she is not Hospice eligible and more a palliative patient. Tiffany Ramirez so they were not able to get services. We talked about having Hospice physicians review case. Tiffany Ramirez and Tiffany Ramirez both in agreement with severe debility, progressive changes. Tiffany Ramirez Ramirez Tiffany Ramirez has an appointment with Tiffany Ramirez today if they are able to get Ramirez to the appointment due to weakness. Tiffany Ramirez they will revisit also with  Tiffany Ramirez and will recontact tomorrow to discuss how to proceed once case reviewed by Hospice Physicians. Tiffany Ramirez and Tiffany Ramirez in agreement. Therapeutic listening, emotional support provided. Questions answered.   History obtained from review of EMR, discussion with Daughter Tiffany Ramirez, Mr. and Ms. Bowery.  I reviewed available labs, medications, imaging, studies and related documents from the EMR.  Records reviewed and summarized above.   ROS Full 14 system review of systems performed and negative with exception of: as per HPI.   Physical Exam: Constitutional: NAD General: frail appearing, thin, very weak, pale, pleasant female EYES: lids intact ENMT: oral mucous membranes moist CV: S1S2, RRR Pulmonary: LCTA, no increased work of breathing, no cough, room air Abdomen: soft and non tender MSK: stand/pivot to transfer; severe muscle wasting Skin: warm and dry Neuro:  +severe generalized weakness,  no cognitive impairment Psych: flat affect, A and O x 3  CURRENT PROBLEM LIST:  Patient Active Problem List   Diagnosis Date Noted   Syncope 09/11/2020   Weakness 09/11/2020   Lymphedema 04/04/2020   Chronic venous insufficiency 04/04/2020   Atypical chest pain 06/14/2018   Paroxysmal atrial fibrillation ([place]) 06/14/2018   Sinus bradycardia 06/13/2018   Iron malabsorption 05/08/2018   Iron deficiency anemia due to chronic blood loss 05/07/2018   Shortness of breath 05/07/2018   Type 2 diabetes mellitus with peripheral angiopathy ([place]) 08/22/2017   Medicare annual wellness visit, initial 02/15/2016   Swelling of limb 12/28/2015   Chronic hyperglycemia 08/13/2014  Paralysis agitans ([place]) 08/28/2013   Hyperlipidemia, mixed 07/28/2013   Arterial vascular disease 07/28/2013   Insomnia with sleep apnea    Sleep disorder    Essential hypertension    PAST MEDICAL HISTORY:  Active Ambulatory Problems    Diagnosis Date Noted   Insomnia with sleep apnea    Sleep disorder    Essential  hypertension    Paralysis agitans ([facility]) 08/28/2013   Chronic hyperglycemia 08/13/2014   Hyperlipidemia, mixed 07/28/2013   Arterial vascular disease 07/28/2013   Swelling of limb 12/28/2015   Iron deficiency anemia due to chronic blood loss 05/07/2018   Iron malabsorption 05/08/2018   Lymphedema 04/04/2020   Chronic venous insufficiency 04/04/2020   Atypical chest pain 06/14/2018   Medicare annual wellness visit, initial 02/15/2016   Paroxysmal atrial fibrillation ([place]) 06/14/2018   Shortness of breath 05/07/2018   Sinus bradycardia 06/13/2018   Type 2 diabetes mellitus with peripheral angiopathy ([place]) 08/22/2017   Syncope 09/11/2020   Weakness 09/11/2020   Resolved Ambulatory Problems    Diagnosis Date Noted   No Resolved Ambulatory Problems   Past Medical History:  Diagnosis Date   Anxiety    Borderline diabetic    Depression    Diabetes mellitus without complication ([place])    Insomnia with sleep apnea, unspecified    Other and unspecified hyperlipidemia    Parkinson's disease ([place])    Skin cancer, basal cell    Unspecified essential hypertension    SOCIAL HX:  Social History   Tobacco Use   Smoking status: Never   Smokeless tobacco: Never  Substance Use Topics   Alcohol use: No   FAMILY HX:  Family History  Problem Relation Age of Onset   Heart failure Mother    Hyperlipidemia Mother    Heart attack Father    Diabetes Father    Brain cancer Sister    Psychiatric Illness Sister    Stroke Sister    Deep vein thrombosis Sister    Lung cancer Brother     reviewed  ALLERGIES:  Allergies  Allergen Reactions   Codeine     Other reaction(s): Other (See Comments) Sick on Ramirez stomach   Phenobarbital Other (See Comments)    Overall restlessness   Other Anxiety    Benadryal     PERTINENT MEDICATIONS:  Outpatient Encounter Medications as of 09/23/2020  Medication Sig   alum & mag hydroxide-simeth (MAALOX MAX) 400-400-40 MG/5ML suspension Take 5 mLs by mouth  every 6 (six) hours as needed for indigestion.   carbidopa-levodopa (SINEMET IR) 25-100 MG tablet Take 1 tablet by mouth 6 (six) times daily. At 7, 10, 1 PM, 4 PM, 7 PM, and 10 PM.   clopidogrel (PLAVIX) 75 MG tablet Take 75 mg by mouth daily.   dexlansoprazole (DEXILANT) 60 MG capsule Take by mouth.   famotidine (PEPCID) 40 MG tablet Take 40 mg by mouth daily.   gabapentin (NEURONTIN) 300 MG capsule    hydrochlorothiazide (HYDRODIURIL) 12.5 MG tablet Take 12.5 mg by mouth daily.   INBRIJA 42 MG CAPS SMARTSIG:Capsule(s) Via Inhaler   linaclotide (LINZESS) 145 MCG CAPS capsule Take 1 capsule (145 mcg total) by mouth daily before breakfast.   nitrofurantoin, macrocrystal-monohydrate, (MACROBID) 100 MG capsule Take 100 mg by mouth 2 (two) times daily.   ondansetron (ZOFRAN ODT) 4 MG disintegrating tablet Take 1 tablet (4 mg total) by mouth every 8 (eight) hours as needed.   ondansetron (ZOFRAN) 8 MG tablet TAKE 1 TABLET (8 MG TOTAL) BY  MOUTH EVERY 4 (FOUR) HOURS AS NEEDED FOR NAUSEA FOR UP TO 20 DAYS   pantoprazole (PROTONIX) 20 MG tablet Take 40 mg by mouth daily.    potassium chloride (KLOR-CON) 10 MEQ tablet Take 1 tablet (10 mEq total) by mouth daily.   Probiotic Product (PROBIOTIC PO) Take by mouth.   simvastatin (ZOCOR) 20 MG tablet Take 20 mg by mouth daily.    sucralfate (CARAFATE) 1 GM/10ML suspension Take by mouth.   No facility-administered encounter medications on file as of 09/23/2020.  Questions and concerns were addressed. The patient/family was encouraged to call with questions and/or concerns. My business card was provided. Provided general support and encouragement, no other unmet needs identified   Thank you for the opportunity to participate in the care of Ms. Ramey.  The palliative care team will continue to follow. Please call our office at [phone] if we can be of additional assistance.   This chart was dictated using voice recognition software.  Despite best efforts  to proofread,  errors can occur which can change the documentation meaning.   Sheyna Pettibone Z Jabron Weese, NP ,   COVID-19 PATIENT SCREENING TOOL Asked and negative response unless otherwise noted:  Have you had symptoms of covid, tested positive or been in contact with someone with symptoms/positive test in the past 5-10 days? NO

## 2020-09-28 ENCOUNTER — Telehealth: Payer: Self-pay | Admitting: Nurse Practitioner

## 2020-09-28 NOTE — Telephone Encounter (Signed)
I received a message from [name], Ms. Odom daughter to return call. I attempted to contact, no answer, message left with contact information.

## 2020-09-29 ENCOUNTER — Telehealth: Payer: Self-pay | Admitting: Nurse Practitioner

## 2020-09-29 NOTE — Telephone Encounter (Signed)
I returned Tiffany Glad, Ms. Hiathcock call. We talked about update how [name] has been doing over last few days since [facility] visit. We talked about hospice philosophy. We talked about aggressive vs comfort care. We talked about primary visit with [name]. We talked about PC visit with [name] stating several times her wishes are for Hospice. We talked about difference between Hospice and PC. We talked about expectations with disease progression of Parkinson. We talked about medical goals. We talked about upcoming appointment next week with [name]. We talked about Hospice services. We talked about once decision is made about Hospice to contact PC, then can proceed with Hospice or schedule PC f/u visit  Total time 25 minutes Documentation 5 minues Phone discussion 20 minutes

## 2020-10-12 ENCOUNTER — Telehealth: Payer: Self-pay | Admitting: Nurse Practitioner

## 2020-10-12 NOTE — Telephone Encounter (Signed)
I called and left a message for [name], Ms. Bordas's daughter for updated, per Dr Ammie Ferrier note appears was going to contact hospice. Following up to see if Dimas Chyle wanted to proceed as it does not appear as order has been received as of yet. Contact information left with message

## 2020-10-15 ENCOUNTER — Telehealth: Payer: Self-pay | Admitting: Nurse Practitioner

## 2020-10-15 ENCOUNTER — Telehealth: Payer: Self-pay

## 2020-10-15 NOTE — Telephone Encounter (Signed)
Tiffany Ramirez, Tiffany Ramirez called with update. Tiffany Ramirez endorses ongoing discussions with [name] about goc including comfort care. We talked about hospice option. Tiffany Ramirez endorses they had further questions about insurance. Offered to have [name] at [facility] billing to contact Tiffany Ramirez to further discuss how insurance works with hospice benefit. Tiffany Ramirez in agreement for [name] to contact her, Chiropodist request and will contact Tiffany Ramirez. We talked about [name] repeated request during PC visit for her to have hospice services. Tiffany Ramirez endorses [name] does verbalize wishes for hospice when she is not feeling good, though on good days she is still not sure if she is ready to start on hospice services. Questions answered about how hospice works, philosophy. We talked about pc visit for f/u for ongoing discussion. Tiffany Ramirez in agreement, scheduled f/u pc visit. Discussed with Tiffany Ramirez if decision made to proceed with hospice prior to [name] visit Tiffany Ramirez agree to contact PC/[name] office so order can be obtained and process for av visit started. Tiffany Ramirez in agreement. Emotional support provided, therapeutic listening  Total time 25 minutes Documentation 5 minutes Phone discussion 20 minutes

## 2020-10-15 NOTE — Telephone Encounter (Signed)
Request received from Christin Kateri Mc, NP to follow up with PCP regarding recent telemedicine visit and discussion on hospice care.    839 am.  Phone call made to PCP office and message left for Kelly-nurse.  907 am.  Incoming call from [facility] for Dr. Sabra Heck.  Discussion was completed on hospice support last week with PCP.  Order will be faxed this am.   910 am.  Christin Guslter, NP notified of pending order for hospice referral.

## 2020-10-15 NOTE — Telephone Encounter (Signed)
86 am.  Incoming call from [facility] at PCP office.  She spoke with patient's daughter [name] and the hospice order is on hold.  [name] would like to speak with Christin Gusler, NP and also to the patient before proceeding.  925 am.  Notified Palliative Care NP of above.

## 2020-10-23 ENCOUNTER — Other Ambulatory Visit: Payer: Self-pay

## 2020-10-23 ENCOUNTER — Inpatient Hospital Stay
Admission: EM | Admit: 2020-10-23 | Discharge: 2020-10-26 | DRG: 689 | Disposition: A | Discharge status: Hospice | Payer: Medicare Other | Attending: Family Medicine | Admitting: Family Medicine

## 2020-10-23 DIAGNOSIS — F02818 Dementia in other diseases classified elsewhere, unspecified severity, with other behavioral disturbance: Secondary | ICD-10-CM | POA: Diagnosis present

## 2020-10-23 DIAGNOSIS — G4730 Sleep apnea, unspecified: Secondary | ICD-10-CM | POA: Diagnosis present

## 2020-10-23 DIAGNOSIS — I480 Paroxysmal atrial fibrillation: Secondary | ICD-10-CM | POA: Diagnosis present

## 2020-10-23 DIAGNOSIS — Z83438 Family history of other disorder of lipoprotein metabolism and other lipidemia: Secondary | ICD-10-CM

## 2020-10-23 DIAGNOSIS — R8271 Bacteriuria: Secondary | ICD-10-CM | POA: Diagnosis present

## 2020-10-23 DIAGNOSIS — N390 Urinary tract infection, site not specified: Secondary | ICD-10-CM | POA: Diagnosis not present

## 2020-10-23 DIAGNOSIS — F0282 Dementia in other diseases classified elsewhere, unspecified severity, with psychotic disturbance: Secondary | ICD-10-CM | POA: Diagnosis present

## 2020-10-23 DIAGNOSIS — F03911 Unspecified dementia, unspecified severity, with agitation: Secondary | ICD-10-CM

## 2020-10-23 DIAGNOSIS — Z885 Allergy status to narcotic agent status: Secondary | ICD-10-CM

## 2020-10-23 DIAGNOSIS — E1151 Type 2 diabetes mellitus with diabetic peripheral angiopathy without gangrene: Secondary | ICD-10-CM | POA: Diagnosis present

## 2020-10-23 DIAGNOSIS — Z79899 Other long term (current) drug therapy: Secondary | ICD-10-CM

## 2020-10-23 DIAGNOSIS — R4182 Altered mental status, unspecified: Secondary | ICD-10-CM | POA: Diagnosis not present

## 2020-10-23 DIAGNOSIS — Z888 Allergy status to other drugs, medicaments and biological substances status: Secondary | ICD-10-CM

## 2020-10-23 DIAGNOSIS — F419 Anxiety disorder, unspecified: Secondary | ICD-10-CM | POA: Diagnosis present

## 2020-10-23 DIAGNOSIS — Z681 Body mass index (BMI) 19 or less, adult: Secondary | ICD-10-CM

## 2020-10-23 DIAGNOSIS — F32A Depression, unspecified: Secondary | ICD-10-CM | POA: Diagnosis present

## 2020-10-23 DIAGNOSIS — E43 Unspecified severe protein-calorie malnutrition: Secondary | ICD-10-CM | POA: Diagnosis present

## 2020-10-23 DIAGNOSIS — Z66 Do not resuscitate: Secondary | ICD-10-CM | POA: Diagnosis present

## 2020-10-23 DIAGNOSIS — K219 Gastro-esophageal reflux disease without esophagitis: Secondary | ICD-10-CM | POA: Diagnosis present

## 2020-10-23 DIAGNOSIS — F061 Catatonic disorder due to known physiological condition: Secondary | ICD-10-CM | POA: Diagnosis present

## 2020-10-23 DIAGNOSIS — Z7902 Long term (current) use of antithrombotics/antiplatelets: Secondary | ICD-10-CM

## 2020-10-23 DIAGNOSIS — Z9841 Cataract extraction status, right eye: Secondary | ICD-10-CM

## 2020-10-23 DIAGNOSIS — G4700 Insomnia, unspecified: Secondary | ICD-10-CM | POA: Diagnosis present

## 2020-10-23 DIAGNOSIS — I10 Essential (primary) hypertension: Secondary | ICD-10-CM | POA: Diagnosis present

## 2020-10-23 DIAGNOSIS — G20 Parkinson's disease: Secondary | ICD-10-CM | POA: Diagnosis present

## 2020-10-23 DIAGNOSIS — Z833 Family history of diabetes mellitus: Secondary | ICD-10-CM

## 2020-10-23 DIAGNOSIS — Z9842 Cataract extraction status, left eye: Secondary | ICD-10-CM

## 2020-10-23 DIAGNOSIS — Z8249 Family history of ischemic heart disease and other diseases of the circulatory system: Secondary | ICD-10-CM

## 2020-10-23 DIAGNOSIS — Z85828 Personal history of other malignant neoplasm of skin: Secondary | ICD-10-CM

## 2020-10-23 DIAGNOSIS — Z20822 Contact with and (suspected) exposure to covid-19: Secondary | ICD-10-CM | POA: Diagnosis present

## 2020-10-23 DIAGNOSIS — E785 Hyperlipidemia, unspecified: Secondary | ICD-10-CM | POA: Diagnosis present

## 2020-10-23 DIAGNOSIS — Z515 Encounter for palliative care: Secondary | ICD-10-CM

## 2020-10-23 NOTE — ED Provider Notes (Signed)
[facility] Emergency Department Provider Note  Time seen: 11:39 PM  I have reviewed the triage vital signs and the nursing notes.   HISTORY  Chief Complaint Altered Mental Status   HPI Tiffany Ramirez is a 87 y.o. female with a past medical history of anxiety, diabetes, depression, Parkinson's, recently diagnosed dementia who presents to the emergency department for medical evaluation.  According to EMS report patient had a violent outburst at home with family which is atypical.  Here the patient is awake alert she is oriented x3.  She states she has been under "surveillance" at home by her family who she states believes she has memory issues.  She states tonight her husband was watching a football game and her daughter was not home at the time so she decided to pack her things to leave.  States her family caught her packing her things and attempted to take them away from her so she tried to keep them and call a friend to come pick her up but she was unsuccessful in doing so.  Family then called 911 to bring her to the emergency department.  Patient denies any medical complaints.  No pain.  No falls.   Past Medical History:  Diagnosis Date   Anxiety    Borderline diabetic    08-2014   Depression    Diabetes mellitus without complication ([place])    Insomnia with sleep apnea, unspecified    Other and unspecified hyperlipidemia    Parkinson's disease ([place])    Skin cancer, basal cell    Sleep disorder    Unspecified essential hypertension     Patient Active Problem List   Diagnosis Date Noted   Syncope 09/11/2020   Weakness 09/11/2020   Lymphedema 04/04/2020   Chronic venous insufficiency 04/04/2020   Atypical chest pain 06/14/2018   Paroxysmal atrial fibrillation ([place]) 06/14/2018   Sinus bradycardia 06/13/2018   Iron malabsorption 05/08/2018   Iron deficiency anemia due to chronic blood loss 05/07/2018   Shortness of breath 05/07/2018   Type 2 diabetes  mellitus with peripheral angiopathy ([place]) 08/22/2017   Medicare annual wellness visit, initial 02/15/2016   Swelling of limb 12/28/2015   Chronic hyperglycemia 08/13/2014   Paralysis agitans ([place]) 08/28/2013   Hyperlipidemia, mixed 07/28/2013   Arterial vascular disease 07/28/2013   Insomnia with sleep apnea    Sleep disorder    Essential hypertension     Past Surgical History:  Procedure Laterality Date   CATARACT EXTRACTION Bilateral     Prior to Admission medications   Medication Sig Start Date End Date Taking? Authorizing Provider  alum & mag hydroxide-simeth (MAALOX MAX) 400-400-40 MG/5ML suspension Take 5 mLs by mouth every 6 (six) hours as needed for indigestion. 08/12/18   Rudene Re, MD  carbidopa-levodopa (SINEMET IR) 25-100 MG tablet Take 1 tablet by mouth 6 (six) times daily. At 7, 10, 1 PM, 4 PM, 7 PM, and 10 PM. 02/18/18   Star Age, MD  clopidogrel (PLAVIX) 75 MG tablet Take 75 mg by mouth daily.    [provider]  dexlansoprazole (DEXILANT) 60 MG capsule Take by mouth. 03/16/20 09/11/20  [provider]  famotidine (PEPCID) 40 MG tablet Take 40 mg by mouth daily. 03/21/20   [provider]  gabapentin (NEURONTIN) 300 MG capsule  03/21/20   [provider]  hydrochlorothiazide (HYDRODIURIL) 12.5 MG tablet Take 12.5 mg by mouth daily. 07/18/18   [provider]  INBRIJA 42 MG CAPS SMARTSIG:Capsule(s) Via  Inhaler 05/13/20   [provider]  linaclotide Rolan Lipa) 145 MCG CAPS capsule Take 1 capsule (145 mcg total) by mouth daily before breakfast. 08/28/18   Star Age, MD  nitrofurantoin, macrocrystal-monohydrate, (MACROBID) 100 MG capsule Take 100 mg by mouth 2 (two) times daily. 09/08/20   [provider]  ondansetron (ZOFRAN ODT) 4 MG disintegrating tablet Take 1 tablet (4 mg total) by mouth every 8 (eight) hours as needed. 08/12/18   Alfred Levins, [place], MD  ondansetron (ZOFRAN) 8 MG tablet TAKE 1 TABLET (8  MG TOTAL) BY MOUTH EVERY 4 (FOUR) HOURS AS NEEDED FOR NAUSEA FOR UP TO 20 DAYS 06/14/20   [provider]  pantoprazole (PROTONIX) 20 MG tablet Take 40 mg by mouth daily.     [provider]  potassium chloride (KLOR-CON) 10 MEQ tablet Take 1 tablet (10 mEq total) by mouth daily. 09/12/20   Wouk, Ailene Rud, MD  Probiotic Product (PROBIOTIC PO) Take by mouth.    [provider]  simvastatin (ZOCOR) 20 MG tablet Take 20 mg by mouth daily.     [provider]  sucralfate (CARAFATE) 1 GM/10ML suspension Take by mouth. 07/17/18 07/17/19  [provider]    Allergies  Allergen Reactions   Codeine     Other reaction(s): Other (See Comments) Sick on her stomach   Phenobarbital Other (See Comments)    Overall restlessness   Other Anxiety    Benadryal    Family History  Problem Relation Age of Onset   Heart failure Mother    Hyperlipidemia Mother    Heart attack Father    Diabetes Father    Brain cancer Sister    Psychiatric Illness Sister    Stroke Sister    Deep vein thrombosis Sister    Lung cancer Brother     Social History Social History   Tobacco Use   Smoking status: Never   Smokeless tobacco: Never  Substance Use Topics   Alcohol use: No   Drug use: No    Review of Systems Constitutional: Negative for fever. Cardiovascular: Negative for chest pain. Respiratory: Negative for shortness of breath. Gastrointestinal: Negative for abdominal pain, vomiting and diarrhea. Genitourinary: Negative for urinary compaints Musculoskeletal: Negative for musculoskeletal complaints Neurological: Negative for headache All other ROS negative  ____________________________________________   PHYSICAL EXAM:  VITAL SIGNS: ED Triage Vitals  Enc Vitals Group     BP 10/23/20 2239 (!) 143/77     Pulse Rate 10/23/20 2239 87     Resp 10/23/20 2239 (!) 21     Temp 10/23/20 2239 98 F (36.7 C)     Temp Source 10/23/20 2239 Oral     SpO2  10/23/20 2239 94 %     Weight 10/23/20 2241 109 lb 5.6 oz (49.6 kg)     Height 10/23/20 2241 5\' 6"  (1.676 m)     Head Circumference --      Peak Flow --      Pain Score 10/23/20 2241 1     Pain Loc --      Pain Edu? --      Excl. in [place]? --    Constitutional: Alert and oriented. Well appearing and in no distress. Eyes: Normal exam ENT      Head: Normocephalic and atraumatic.      Mouth/Throat: Mucous membranes are moist. Cardiovascular: Normal rate, regular rhythm. Respiratory: Normal respiratory effort without tachypnea nor retractions. Breath sounds are clear Gastrointestinal: Soft and nontender. No distention.   Musculoskeletal:  Nontender with normal range of motion in all extremities.  Neurologic:  Normal speech and language. No gross focal neurologic deficits Skin:  Skin is warm, dry and intact.  Psychiatric: Mood and affect are normal.  ____________________________________________  INITIAL IMPRESSION / ASSESSMENT AND PLAN / ED COURSE  Pertinent labs & imaging results that were available during my care of the patient were reviewed by me and considered in my medical decision making (see chart for details).   Patient presents to the emergency department for evaluation after a violent outburst at home.  Here the patient is actually able to give a good history she is oriented x3.  We will check labs as precaution including blood work and a urine sample.  Patient has no medical complaints.  Highly suspect tonight's episode was due to her developing dementia.  Patient states she realizes she can be difficult to get along with at times and does not always agree with her family's evaluation of her.  We will discuss with the family once her medical work-up is been completed.  Brynn Mulgrew Marcantel was evaluated in Emergency Department on 10/23/2020 for the symptoms described in the history of present illness. She was evaluated in the context of the global COVID-19 pandemic, which necessitated  consideration that the patient might be at risk for infection with the SARS-CoV-2 virus that causes COVID-19. Institutional protocols and algorithms that pertain to the evaluation of patients at risk for COVID-19 are in a state of rapid change based on information released by regulatory bodies including the CDC and federal and state organizations. These policies and algorithms were followed during the patient's care in the ED.  ____________________________________________   FINAL CLINICAL IMPRESSION(S) / ED DIAGNOSES  Dementia   Harvest Dark, MD 10/25/20 [phone]

## 2020-10-23 NOTE — ED Triage Notes (Signed)
Patient to ED from home via EMS for increased confusion, new onset violent and paranoid behavior. Per EMS, family states patient has new diagnosis of dementia.

## 2020-10-24 ENCOUNTER — Encounter: Payer: Self-pay | Admitting: Internal Medicine

## 2020-10-24 DIAGNOSIS — I10 Essential (primary) hypertension: Secondary | ICD-10-CM

## 2020-10-24 DIAGNOSIS — N3001 Acute cystitis with hematuria: Secondary | ICD-10-CM

## 2020-10-24 DIAGNOSIS — G20 Parkinson's disease: Secondary | ICD-10-CM | POA: Diagnosis not present

## 2020-10-24 DIAGNOSIS — I480 Paroxysmal atrial fibrillation: Secondary | ICD-10-CM

## 2020-10-24 DIAGNOSIS — N390 Urinary tract infection, site not specified: Secondary | ICD-10-CM | POA: Diagnosis present

## 2020-10-24 DIAGNOSIS — R8271 Bacteriuria: Secondary | ICD-10-CM | POA: Diagnosis present

## 2020-10-24 DIAGNOSIS — E1151 Type 2 diabetes mellitus with diabetic peripheral angiopathy without gangrene: Secondary | ICD-10-CM

## 2020-10-24 LAB — COMPREHENSIVE METABOLIC PANEL
ALT: <5 U/L (ref 0–44)
AST: 15 U/L (ref 15–41)
Albumin: 4.1 g/dL (ref 3.5–5.0)
Alkaline Phosphatase: 76 U/L (ref 38–126)
Anion gap: 11 (ref 5–15)
BUN: 17 mg/dL (ref 8–23)
CO2: 28 mmol/L (ref 22–32)
Calcium: 9.5 mg/dL (ref 8.9–10.3)
Chloride: 99 mmol/L (ref 98–111)
Creatinine, Ser: 0.92 mg/dL (ref 0.44–1.00)
GFR, Estimated: 59 mL/min — ABNORMAL LOW (ref >60)
Glucose, Bld: 118 mg/dL — ABNORMAL HIGH (ref 70–99)
Potassium: 3 mmol/L — ABNORMAL LOW (ref 3.5–5.1)
Sodium: 138 mmol/L (ref 135–145)
Total Bilirubin: 1.2 mg/dL (ref 0.3–1.2)
Total Protein: 7.1 g/dL (ref 6.5–8.1)

## 2020-10-24 LAB — URINALYSIS, COMPLETE (UACMP) WITH MICROSCOPIC
Bacteria, UA: NONE SEEN
Bilirubin Urine: NEGATIVE
Glucose, UA: NEGATIVE mg/dL
Ketones, ur: 5 mg/dL — AB
Nitrite: NEGATIVE
Protein, ur: 30 mg/dL — AB
Specific Gravity, Urine: 1.01 (ref 1.005–1.030)
WBC, UA: >50 WBC/hpf — ABNORMAL HIGH (ref 0–5)
pH: 7 (ref 5.0–8.0)

## 2020-10-24 LAB — CBC
HCT: 34.2 % — ABNORMAL LOW (ref 36.0–46.0)
Hemoglobin: 10.9 g/dL — ABNORMAL LOW (ref 12.0–15.0)
MCH: 25.1 pg — ABNORMAL LOW (ref 26.0–34.0)
MCHC: 31.9 g/dL (ref 30.0–36.0)
MCV: 78.8 fL — ABNORMAL LOW (ref 80.0–100.0)
Platelets: 209 10*3/uL (ref 150–400)
RBC: 4.34 MIL/uL (ref 3.87–5.11)
RDW: 16.4 % — ABNORMAL HIGH (ref 11.5–15.5)
WBC: 4.7 10*3/uL (ref 4.0–10.5)
nRBC: 0 % (ref 0.0–0.2)

## 2020-10-24 LAB — GLUCOSE, CAPILLARY: Glucose-Capillary: 113 mg/dL — ABNORMAL HIGH (ref 70–99)

## 2020-10-24 LAB — RESP PANEL BY RT-PCR (FLU A&B, COVID) ARPGX2
Influenza A by PCR: NEGATIVE
Influenza B by PCR: NEGATIVE
SARS Coronavirus 2 by RT PCR: NEGATIVE

## 2020-10-24 MED ORDER — ONDANSETRON 4 MG PO TBDP: 4.0000 mg | ORAL_TABLET | Freq: Three times a day (TID) | ORAL | Status: DC | PRN

## 2020-10-24 MED ORDER — POTASSIUM CHLORIDE CRYS ER 10 MEQ PO TBCR
10.0000 meq | EXTENDED_RELEASE_TABLET | Freq: Every day | ORAL | Status: DC
  Administered 2020-10-26: 10 meq via ORAL
  Filled 2020-10-24: qty 1

## 2020-10-24 MED ORDER — POTASSIUM CHLORIDE CRYS ER 10 MEQ PO TBCR
10.0000 meq | EXTENDED_RELEASE_TABLET | Freq: Every day | ORAL | Status: DC
  Filled 2020-10-24: qty 1

## 2020-10-24 MED ORDER — HYDROCHLOROTHIAZIDE 25 MG PO TABS
12.5000 mg | ORAL_TABLET | Freq: Every day | ORAL | Status: DC
  Administered 2020-10-24 – 2020-10-26 (×2): 12.5 mg via ORAL
  Filled 2020-10-24 (×2): qty 1

## 2020-10-24 MED ORDER — ENOXAPARIN SODIUM 40 MG/0.4ML IJ SOSY
40.0000 mg | PREFILLED_SYRINGE | INTRAMUSCULAR | Status: DC
  Administered 2020-10-24 – 2020-10-26 (×3): 40 mg via SUBCUTANEOUS
  Filled 2020-10-24 (×3): qty 0.4

## 2020-10-24 MED ORDER — LINACLOTIDE 145 MCG PO CAPS
145.0000 ug | ORAL_CAPSULE | Freq: Every day | ORAL | Status: DC
  Administered 2020-10-26: 145 ug via ORAL
  Filled 2020-10-24 (×3): qty 1

## 2020-10-24 MED ORDER — SODIUM CHLORIDE 0.9 % IV SOLN
1.0000 g | Freq: Once | INTRAVENOUS | Status: AC
  Administered 2020-10-24: 1 g via INTRAVENOUS
  Filled 2020-10-24: qty 10

## 2020-10-24 MED ORDER — PANTOPRAZOLE SODIUM 40 MG PO TBEC
40.0000 mg | DELAYED_RELEASE_TABLET | Freq: Every day | ORAL | Status: DC
  Administered 2020-10-24 – 2020-10-26 (×2): 40 mg via ORAL
  Filled 2020-10-24 (×2): qty 1

## 2020-10-24 MED ORDER — ALUM & MAG HYDROXIDE-SIMETH 200-200-20 MG/5ML PO SUSP: 5.0000 mL | Freq: Four times a day (QID) | ORAL | Status: DC | PRN

## 2020-10-24 MED ORDER — LEVODOPA 42 MG IN CAPS: 84.0000 mg | ORAL_CAPSULE | Freq: Every day | RESPIRATORY_TRACT | Status: DC

## 2020-10-24 MED ORDER — CARBIDOPA-LEVODOPA 25-100 MG PO TABS: 1.0000 | ORAL_TABLET | Freq: Every day | ORAL | Status: DC

## 2020-10-24 MED ORDER — POTASSIUM CHLORIDE IN NACL 20-0.9 MEQ/L-% IV SOLN
INTRAVENOUS | Status: DC
  Filled 2020-10-24 (×3): qty 1000

## 2020-10-24 MED ORDER — ONDANSETRON 4 MG PO TBDP: 4.0000 mg | ORAL_TABLET | Freq: Once | ORAL | Status: AC

## 2020-10-24 MED ORDER — CIPROFLOXACIN HCL 500 MG PO TABS
250.0000 mg | ORAL_TABLET | Freq: Two times a day (BID) | ORAL | Status: DC
  Administered 2020-10-24 (×2): 250 mg via ORAL
  Filled 2020-10-24 (×2): qty 1

## 2020-10-24 MED ORDER — LEVODOPA 42 MG IN CAPS
42.0000 mg | ORAL_CAPSULE | Freq: Every day | RESPIRATORY_TRACT | Status: DC | PRN
  Filled 2020-10-24: qty 1

## 2020-10-24 MED ORDER — QUETIAPINE FUMARATE 25 MG PO TABS
25.0000 mg | ORAL_TABLET | Freq: Every day | ORAL | Status: DC
  Administered 2020-10-24: 25 mg via ORAL
  Filled 2020-10-24: qty 1

## 2020-10-24 MED ORDER — GABAPENTIN 300 MG PO CAPS
300.0000 mg | ORAL_CAPSULE | Freq: Every day | ORAL | Status: DC
  Administered 2020-10-24: 300 mg via ORAL
  Filled 2020-10-24: qty 1

## 2020-10-24 MED ORDER — CIPROFLOXACIN HCL 500 MG PO TABS: 250.0000 mg | ORAL_TABLET | Freq: Two times a day (BID) | ORAL | Status: DC

## 2020-10-24 MED ORDER — CARBIDOPA-LEVODOPA 25-100 MG PO TABS
2.0000 | ORAL_TABLET | Freq: Every day | ORAL | Status: DC
  Administered 2020-10-24 – 2020-10-26 (×4): 2 via ORAL
  Filled 2020-10-24 (×10): qty 2

## 2020-10-24 MED ORDER — CLOPIDOGREL BISULFATE 75 MG PO TABS
75.0000 mg | ORAL_TABLET | Freq: Every day | ORAL | Status: DC
  Administered 2020-10-24 – 2020-10-26 (×2): 75 mg via ORAL
  Filled 2020-10-24 (×2): qty 1

## 2020-10-24 MED ORDER — SIMVASTATIN 20 MG PO TABS
20.0000 mg | ORAL_TABLET | Freq: Every day | ORAL | Status: DC
  Administered 2020-10-24 – 2020-10-26 (×2): 20 mg via ORAL
  Filled 2020-10-24 (×3): qty 1

## 2020-10-24 MED ORDER — POTASSIUM CHLORIDE CRYS ER 20 MEQ PO TBCR
40.0000 meq | EXTENDED_RELEASE_TABLET | Freq: Once | ORAL | Status: AC
  Administered 2020-10-24: 40 meq via ORAL
  Filled 2020-10-24: qty 2

## 2020-10-24 MED ORDER — ONDANSETRON 4 MG PO TBDP
ORAL_TABLET | ORAL | Status: AC
  Administered 2020-10-24: 4 mg via ORAL
  Filled 2020-10-24: qty 1

## 2020-10-24 NOTE — ED Notes (Signed)
RN to bedside. Tech at bedside to help pt to toilet. Pt urinated. Then back to bed.

## 2020-10-24 NOTE — ED Notes (Signed)
Patient reports that she is getting nauseous again, MD notified and aware

## 2020-10-24 NOTE — ED Notes (Signed)
Care transferred, report received from [place], [place]

## 2020-10-24 NOTE — H&P (Signed)
History and Physical    Tiffany Ramirez WRU:[id number] DOB: [birth year] DOA: 10/23/2020  PCP: Rusty Aus, MD   Patient coming from: home  I have personally briefly reviewed patient's old medical records in [place]  Chief Complaint: increased psychotic symptoms  HPI: Tiffany Ramirez is a 87 y.o. female with medical history significant of DM, Parkinson's disease, HTN has been followed by neurology with last OV 09/14/20.She has been having increased symptoms of dementia with paranoia. For altered mental status she presents to [facility] for evaluation.   ED Course: T98  179/78  72  rr 15. Lab: K 3.o, glucose 118, CBC nl, U/A with >50 WBC/hopf, no bacteria. Rocephin given in ED. TRH called to admit for continued management of UTI and Parkinson's dementia/psychosis.   Review of Systems: As per HPI otherwise 10 point review of systems negative.    Past Medical History:  Diagnosis Date   Anxiety    Borderline diabetic    08-2014   Depression    Diabetes mellitus without complication ([place])    Insomnia with sleep apnea, unspecified    Other and unspecified hyperlipidemia    Parkinson's disease ([place])    Skin cancer, basal cell    Sleep disorder    Unspecified essential hypertension     Past Surgical History:  Procedure Laterality Date   CATARACT EXTRACTION Bilateral     Soc Hx - married 66 years!! Two daughters, 4 grands, 1 grreat-grand. Full-time Materials engineer. Lives with elderly husband. One daughter is local.    reports that she has never smoked. She has never used smokeless tobacco. She reports that she does not drink alcohol and does not use drugs.  Allergies  Allergen Reactions   Codeine     Other reaction(s): Other (See Comments) Sick on her stomach   Phenobarbital Other (See Comments)    Overall restlessness   Other Anxiety    Benadryal    Family History  Problem Relation Age of Onset   Heart failure Mother    Hyperlipidemia Mother    Heart attack  Father    Diabetes Father    Brain cancer Sister    Psychiatric Illness Sister    Stroke Sister    Deep vein thrombosis Sister    Lung cancer Brother      Prior to Admission medications   Medication Sig Start Date End Date Taking? Authorizing Provider  alum & mag hydroxide-simeth (MAALOX MAX) 400-400-40 MG/5ML suspension Take 5 mLs by mouth every 6 (six) hours as needed for indigestion. 08/12/18   Rudene Re, MD  carbidopa-levodopa (SINEMET IR) 25-100 MG tablet Take 1 tablet by mouth 6 (six) times daily. At 7, 10, 1 PM, 4 PM, 7 PM, and 10 PM. 02/18/18   Star Age, MD  clopidogrel (PLAVIX) 75 MG tablet Take 75 mg by mouth daily.    [provider]  dexlansoprazole (DEXILANT) 60 MG capsule Take by mouth. 03/16/20 09/11/20  [provider]  famotidine (PEPCID) 40 MG tablet Take 40 mg by mouth daily. 03/21/20   [provider]  gabapentin (NEURONTIN) 300 MG capsule  03/21/20   [provider]  hydrochlorothiazide (HYDRODIURIL) 12.5 MG tablet Take 12.5 mg by mouth daily. 07/18/18   [provider]  INBRIJA 42 MG CAPS SMARTSIG:Capsule(s) Via Inhaler 05/13/20   [provider]  linaclotide Rolan Lipa) 145 MCG CAPS capsule Take 1 capsule (145 mcg total) by mouth daily before breakfast. 08/28/18   Star Age, MD  nitrofurantoin, macrocrystal-monohydrate, (  MACROBID) 100 MG capsule Take 100 mg by mouth 2 (two) times daily. 09/08/20   [provider]  ondansetron (ZOFRAN ODT) 4 MG disintegrating tablet Take 1 tablet (4 mg total) by mouth every 8 (eight) hours as needed. 08/12/18   Alfred Levins, [place], MD  ondansetron (ZOFRAN) 8 MG tablet TAKE 1 TABLET (8 MG TOTAL) BY MOUTH EVERY 4 (FOUR) HOURS AS NEEDED FOR NAUSEA FOR UP TO 20 DAYS 06/14/20   [provider]  pantoprazole (PROTONIX) 20 MG tablet Take 40 mg by mouth daily.     [provider]  potassium chloride (KLOR-CON) 10 MEQ tablet Take 1 tablet (10 mEq total) by mouth  daily. 09/12/20   Wouk, Ailene Rud, MD  Probiotic Product (PROBIOTIC PO) Take by mouth.    [provider]  simvastatin (ZOCOR) 20 MG tablet Take 20 mg by mouth daily.     [provider]  sucralfate (CARAFATE) 1 GM/10ML suspension Take by mouth. 07/17/18 07/17/19  [provider]    Physical Exam: Vitals:   10/24/20 0830 10/24/20 0915 10/24/20 1000 10/24/20 1100  BP: (!) 160/95 (!) 146/86 (!) 179/78 (!) 174/72  Pulse:  (!) 103 72 70  Resp: 16 16 15 18   Temp:      TempSrc:      SpO2:  96% 97% 99%  Weight:      Height:         Vitals:   10/24/20 0830 10/24/20 0915 10/24/20 1000 10/24/20 1100  BP: (!) 160/95 (!) 146/86 (!) 179/78 (!) 174/72  Pulse:  (!) 103 72 70  Resp: 16 16 15 18   Temp:      TempSrc:      SpO2:  96% 97% 99%  Weight:      Height:       General: elderly and frail appearing woman in no distressEyes: PERRL, lids and conjunctivae normal ENMT: Mucous membranes are moist. Posterior pharynx clear of any exudate or lesions.Normal dentition.  Neck: normal, supple, no masses, no thyromegaly Respiratory: clear to auscultation bilaterally, no wheezing, no crackles. Normal respiratory effort. No accessory muscle use.  Cardiovascular: Regular rate and rhythm, no murmurs / rubs / gallops. No extremity edema. 2+ pedal pulses. No carotid bruits.  Abdomen: no tenderness, no masses palpated. No hepatosplenomegaly. Bowel sounds positive.  Musculoskeletal: no clubbing / cyanosis. No joint deformity upper and lower extremities. Good ROM, no contractures. Normal muscle tone.  Skin: no rashes, lesions, ulcers. No induration Neurologic: CN 2-12 grossly intact. Sensation intact to light touch. Resting low frequency tremor noted, most pronounced right hand. Speech is clear, just a little breathy.  Psychiatric: Normal judgment and insight. Alert and oriented x 3. Normal mood at exam.     Labs on Admission: I have personally reviewed following labs and imaging  studies  CBC: Recent Labs  Lab 10/23/20 2245  WBC 4.7  HGB 10.9*  HCT 34.2*  MCV 78.8*  PLT 154   Basic Metabolic Panel: Recent Labs  Lab 10/23/20 2245  NA 138  K 3.0*  CL 99  CO2 28  GLUCOSE 118*  BUN 17  CREATININE 0.92  CALCIUM 9.5   GFR: Estimated Creatinine Clearance: 31.8 mL/min (by C-G formula based on SCr of 0.92 mg/dL). Liver Function Tests: Recent Labs  Lab 10/23/20 2245  AST 15  ALT <5  ALKPHOS 76  BILITOT 1.2  PROT 7.1  ALBUMIN 4.1   No results for input(s): LIPASE, AMYLASE in the last 168 hours. No results for input(s):  AMMONIA in the last 168 hours. Coagulation Profile: No results for input(s): INR, PROTIME in the last 168 hours. Cardiac Enzymes: No results for input(s): CKTOTAL, CKMB, CKMBINDEX, TROPONINI in the last 168 hours. BNP (last 3 results) No results for input(s): PROBNP in the last 8760 hours. HbA1C: No results for input(s): HGBA1C in the last 72 hours. CBG: No results for input(s): GLUCAP in the last 168 hours. Lipid Profile: No results for input(s): CHOL, HDL, LDLCALC, TRIG, CHOLHDL, LDLDIRECT in the last 72 hours. Thyroid Function Tests: No results for input(s): TSH, T4TOTAL, FREET4, T3FREE, THYROIDAB in the last 72 hours. Anemia Panel: No results for input(s): VITAMINB12, FOLATE, FERRITIN, TIBC, IRON, RETICCTPCT in the last 72 hours. Urine analysis:    Component Value Date/Time   COLORURINE YELLOW (A) 10/24/2020 0034   APPEARANCEUR CLOUDY (A) 10/24/2020 0034   APPEARANCEUR Clear 03/05/2018 1438   LABSPEC 1.010 10/24/2020 0034   PHURINE 7.0 10/24/2020 0034   GLUCOSEU NEGATIVE 10/24/2020 0034   HGBUR SMALL (A) 10/24/2020 0034   BILIRUBINUR NEGATIVE 10/24/2020 0034   BILIRUBINUR Negative 03/05/2018 1438   KETONESUR 5 (A) 10/24/2020 0034   PROTEINUR 30 (A) 10/24/2020 0034   NITRITE NEGATIVE 10/24/2020 0034   LEUKOCYTESUR LARGE (A) 10/24/2020 0034    Radiological Exams on Admission: No results found.  EKG:  Independently reviewed. No EKG on chart  Assessment/Plan Active Problems:   Essential hypertension   Paralysis agitans (HCC)   Paroxysmal atrial fibrillation (HCC)   Type 2 diabetes mellitus with peripheral angiopathy (HCC)   UTI (urinary tract infection)   Acute lower UTI   UTI - patient with lower UTI. Afebrile. U/A w/o bacteria. Received rocephin in ED Plan Cipro 250 mg bid x 3 days  2. Parkinson's disease - patient with progressive parkinson's related dementia with psychotic behavior. This has been anticipated.Her symptoms are making her care at home more difficult Plan Seroquel 25 mg qHS. Discussed risk and benefit with Mrs. Ena Dawley, daughter  F/u with neurology as directed  3. DM - on no medication Plan Low carb diet  Ss  4. PAF - regular rhythm on exam Plan  12 lead EKG  Continue home meds  5. GI - h/o reflux and hemorrhoids Plan Continue PPI, continue linzess  6. Goals of Care - confirmed DNR status daughter. She has had palliative care consult as outpatient is is scheduled for hospice eval Monday 10/03. Plan TOC to assist with [facility] needs and continued hospice evaluation.   DVT prophylaxis: lovenox  Code Status: DNR  Family Communication: Spoke with Manuela Neptune, daughter in [place]. She agees with Tx plan including code status and TOC consult. She is in touch with Mr. Sunderlin.  Disposition Plan: Home 24-48 hrs  Consults called: none  Admission status: obs    Adella Hare MD [facility] Pager [phone]  If 7PM-7AM, please contact night-coverage [email or web address] Password [facility]  10/24/2020, 11:39 AM

## 2020-10-24 NOTE — ED Notes (Signed)
Pt had some bright red bleeding when she wiped. Small amount noted in toilet. Will notify MD.

## 2020-10-24 NOTE — ED Notes (Signed)
I&O cath performed using sterile technique. Urine sample labeled and sent to the lab, pt tolerated well.

## 2020-10-24 NOTE — Progress Notes (Signed)
Patient ID: THREASA KINCH, female   DOB: 11/11/1928, 87 y.o.   MRN: [id number]  Patient arrived to floor via stretcher from ED. External catheter placed. Sacrum is pink upon admission, scattered bruising on BLE. Patient is not speaking to staff at this time. Does not appear to be in pain or any distress. VSS.  BP (!) 173/93 (BP Location: Right Arm)   Pulse 87   Temp 98.3 F (36.8 C) (Oral)   Resp 18   Ht 5\' 6"  (1.676 m)   Wt 49.6 kg   SpO2 100%   BMI 17.65 kg/m    Haydee Salter, RN

## 2020-10-24 NOTE — ED Notes (Signed)
Pt encouraged to provide a urine sample, states she needs some water. Water provided

## 2020-10-24 NOTE — ED Provider Notes (Signed)
I assumed care of this patient approximately 0 700.  Please see operating vita's note for full details regarding patient's initial evaluation assessment.  In brief patient presents with a history of Parkinson's and some dementia with increased paranoia and having to leave her home.  Initial labs are reassuring and patient does not appear septic and has a nonfocal exam.  Patient denies any acute complaints.  Plan is to follow-up UA discussed with family.  UA is consistent with cystitis.  Urine culture ordered and patient given dose of Rocephin.  Plan to admit to medicine service for further evaluation management given increased confusion from baseline in the setting of UTI.  Of note patient's daughter did call and wishes to provide patient's neurologist phone number:[phone]   Lucrezia Starch, MD 10/24/20 [phone]

## 2020-10-25 ENCOUNTER — Telehealth: Payer: Self-pay | Admitting: Nurse Practitioner

## 2020-10-25 DIAGNOSIS — G4700 Insomnia, unspecified: Secondary | ICD-10-CM | POA: Diagnosis present

## 2020-10-25 DIAGNOSIS — F419 Anxiety disorder, unspecified: Secondary | ICD-10-CM | POA: Diagnosis present

## 2020-10-25 DIAGNOSIS — F02818 Dementia in other diseases classified elsewhere, unspecified severity, with other behavioral disturbance: Secondary | ICD-10-CM | POA: Diagnosis present

## 2020-10-25 DIAGNOSIS — Z515 Encounter for palliative care: Secondary | ICD-10-CM

## 2020-10-25 DIAGNOSIS — Z833 Family history of diabetes mellitus: Secondary | ICD-10-CM | POA: Diagnosis not present

## 2020-10-25 DIAGNOSIS — G20 Parkinson's disease: Secondary | ICD-10-CM | POA: Diagnosis present

## 2020-10-25 DIAGNOSIS — Z9842 Cataract extraction status, left eye: Secondary | ICD-10-CM | POA: Diagnosis not present

## 2020-10-25 DIAGNOSIS — E43 Unspecified severe protein-calorie malnutrition: Secondary | ICD-10-CM | POA: Diagnosis present

## 2020-10-25 DIAGNOSIS — Z8249 Family history of ischemic heart disease and other diseases of the circulatory system: Secondary | ICD-10-CM | POA: Diagnosis not present

## 2020-10-25 DIAGNOSIS — N390 Urinary tract infection, site not specified: Secondary | ICD-10-CM | POA: Diagnosis present

## 2020-10-25 DIAGNOSIS — Z9841 Cataract extraction status, right eye: Secondary | ICD-10-CM | POA: Diagnosis not present

## 2020-10-25 DIAGNOSIS — Z85828 Personal history of other malignant neoplasm of skin: Secondary | ICD-10-CM | POA: Diagnosis not present

## 2020-10-25 DIAGNOSIS — Z681 Body mass index (BMI) 19 or less, adult: Secondary | ICD-10-CM | POA: Diagnosis not present

## 2020-10-25 DIAGNOSIS — F0282 Dementia in other diseases classified elsewhere, unspecified severity, with psychotic disturbance: Secondary | ICD-10-CM | POA: Diagnosis present

## 2020-10-25 DIAGNOSIS — R4182 Altered mental status, unspecified: Secondary | ICD-10-CM | POA: Diagnosis present

## 2020-10-25 DIAGNOSIS — K219 Gastro-esophageal reflux disease without esophagitis: Secondary | ICD-10-CM | POA: Diagnosis present

## 2020-10-25 DIAGNOSIS — E1151 Type 2 diabetes mellitus with diabetic peripheral angiopathy without gangrene: Secondary | ICD-10-CM | POA: Diagnosis present

## 2020-10-25 DIAGNOSIS — E785 Hyperlipidemia, unspecified: Secondary | ICD-10-CM | POA: Diagnosis present

## 2020-10-25 DIAGNOSIS — I10 Essential (primary) hypertension: Secondary | ICD-10-CM | POA: Diagnosis present

## 2020-10-25 DIAGNOSIS — I480 Paroxysmal atrial fibrillation: Secondary | ICD-10-CM | POA: Diagnosis present

## 2020-10-25 DIAGNOSIS — Z66 Do not resuscitate: Secondary | ICD-10-CM | POA: Diagnosis present

## 2020-10-25 DIAGNOSIS — Z20822 Contact with and (suspected) exposure to covid-19: Secondary | ICD-10-CM | POA: Diagnosis present

## 2020-10-25 DIAGNOSIS — F32A Depression, unspecified: Secondary | ICD-10-CM | POA: Diagnosis present

## 2020-10-25 DIAGNOSIS — G4730 Sleep apnea, unspecified: Secondary | ICD-10-CM | POA: Diagnosis present

## 2020-10-25 DIAGNOSIS — F061 Catatonic disorder due to known physiological condition: Secondary | ICD-10-CM | POA: Diagnosis present

## 2020-10-25 LAB — BASIC METABOLIC PANEL
Anion gap: 7 (ref 5–15)
BUN: 22 mg/dL (ref 8–23)
CO2: 26 mmol/L (ref 22–32)
Calcium: 8.8 mg/dL — ABNORMAL LOW (ref 8.9–10.3)
Chloride: 104 mmol/L (ref 98–111)
Creatinine, Ser: 0.84 mg/dL (ref 0.44–1.00)
GFR, Estimated: >60 mL/min (ref >60)
Glucose, Bld: 89 mg/dL (ref 70–99)
Potassium: 3.6 mmol/L (ref 3.5–5.1)
Sodium: 137 mmol/L (ref 135–145)

## 2020-10-25 LAB — CBC
HCT: 30.7 % — ABNORMAL LOW (ref 36.0–46.0)
Hemoglobin: 10 g/dL — ABNORMAL LOW (ref 12.0–15.0)
MCH: 25.6 pg — ABNORMAL LOW (ref 26.0–34.0)
MCHC: 32.6 g/dL (ref 30.0–36.0)
MCV: 78.5 fL — ABNORMAL LOW (ref 80.0–100.0)
Platelets: 158 10*3/uL (ref 150–400)
RBC: 3.91 MIL/uL (ref 3.87–5.11)
RDW: 16.4 % — ABNORMAL HIGH (ref 11.5–15.5)
WBC: 3.4 10*3/uL — ABNORMAL LOW (ref 4.0–10.5)
nRBC: 0 % (ref 0.0–0.2)

## 2020-10-25 LAB — URINE CULTURE

## 2020-10-25 MED ORDER — ENSURE ENLIVE PO LIQD
237.0000 mL | Freq: Two times a day (BID) | ORAL | Status: DC
  Administered 2020-10-26 (×2): 237 mL via ORAL

## 2020-10-25 MED ORDER — SODIUM CHLORIDE 0.9 % IV SOLN
1.0000 g | INTRAVENOUS | Status: DC
  Administered 2020-10-25: 1 g via INTRAVENOUS
  Filled 2020-10-25: qty 1

## 2020-10-25 MED ORDER — HYDRALAZINE HCL 20 MG/ML IJ SOLN
5.0000 mg | Freq: Four times a day (QID) | INTRAMUSCULAR | Status: DC | PRN
  Administered 2020-10-25: 5 mg via INTRAVENOUS
  Filled 2020-10-25: qty 1

## 2020-10-25 MED ORDER — CHLORHEXIDINE GLUCONATE CLOTH 2 % EX PADS
6.0000 | MEDICATED_PAD | Freq: Every day | CUTANEOUS | Status: DC
  Administered 2020-10-26: 6 via TOPICAL

## 2020-10-25 NOTE — Progress Notes (Signed)
Patient would open her eyes and then close them.  Patient not awake enough to take PO meds including sinemet and antibiotic.  MD notified and requested change in route of medication.

## 2020-10-25 NOTE — Progress Notes (Signed)
[facility]) Hospital Liaison Note   Received request from Transitions of Care Manager Isaias Cowman, RN for family interest in [facility]. Visited patient at bedside and spoke with daughter [name] to confirm interest and explain services. Patient chart and information reviewed by [facility] physician. Hospice Home eligibility confirmed.    Unfortunately, Hospice Home is not able to offer a room today. Family and [facility] Manager aware hospital liaison will follow up tomorrow or sooner if a room becomes available.    Please do not hesitate to call with any hospice related questions.    Thank you for the opportunity to participate in this patient's care.   Bobbie "Loren Racer, RN, BSN [facility] Liaison [phone]

## 2020-10-25 NOTE — Progress Notes (Signed)
PT Cancellation Note  Patient Details Name: Tiffany Ramirez MRN: [id number] DOB: 03-24-1928   Cancelled Treatment:    Reason Eval/Treat Not Completed: Other (comment).  PT consult received.  Chart reviewed.  Per discussion with pt's nurse, will hold PT today (d/t pt's impaired level of alertness).  Will re-attempt PT evaluation tomorrow.  Leitha Bleak, PT 10/25/20, 2:24 PM

## 2020-10-25 NOTE — Assessment & Plan Note (Signed)
-   Stop antibiotics.

## 2020-10-25 NOTE — Progress Notes (Signed)
Patient's blood pressure was 171/81 so Dr. Damita Dunnings was notified because patient is refusing po meds.  IV prn med was ordered.  Will continue to monitor.  Christene Slates

## 2020-10-25 NOTE — Telephone Encounter (Signed)
Tiffany Glad, Ms. Calvey daughter called, left a message to cancel PC visit today, [name] currently hospitalized. I called Tiffany Glad, Ms [facility] daughter just called me. Tiffany Ramirez endorses she had questions about Hospice and Hospice eligibility as when they mentioned Hospice to one of the hospital Physicians, Tiffany Ramirez endorses they were told they were not sure if she is eligible. I explained that she is Hospice eligible per Dr Gilford Rile. Tiffany Ramirez endorses she has talked to Dr Ammie Ferrier office. Desma Paganini husband and I reviewed Hospice philosophy, services Hospice provide, what is covered. Revisited the conversation Tiffany Ramirez had with [name] (billing for Hospice) about how the financial end of Hospice works, what is covered. We talked at length why [name] is eligible for Hospice services. We talked about disease progression of Parkinson. Tiffany Ramirez endorses she will further talk with her sister who is at the bedside with [name]. Tiffany Ramirez endorses she would have to get private caregivers scheduled to come back in the home prior to d/c home. We talked about pc. Therapeutic listening, emotional support provided. Tiffany Ramirez asked for [facility] liaison to contact if needed. We talked about option for d/c home with Hospice should Hospitalist/Hospital team in agreement.   Total time 25 minutes Documentation 5 minutes Phone discussion 20 minutes

## 2020-10-25 NOTE — Assessment & Plan Note (Signed)
Patient admitted for psychotic behavior, has since become catatonic.  Able to follow some simple commands but refuses all medications and PO intake.   Discussed with duaghter/POA who involved husband.  Patient appears to be declining.  Feeding tube, even temporary NG tube would be against the patient's wishes, and given her recent trajectory and difficulty managing symptoms by neurology, would not return her to a reasonable functional status.  - Consult Hospice, anticipate discharge to residential hospice for end of life care - Comfort feeds

## 2020-10-25 NOTE — Assessment & Plan Note (Signed)
As evidenced by severe loss of subcutaneous muscle mass and severe loss of subcutaneous fat.

## 2020-10-25 NOTE — Assessment & Plan Note (Signed)
Patient NPO

## 2020-10-25 NOTE — Telephone Encounter (Signed)
I spoke with Dr Loleta Books, reviewed case. With pc following, requested to call family with update. I talked to [name], updated on clinical status. [name] says she does have intermit episodes like this but it appears to be happening more frequently. I explained concern for aspiration, safety, we talked about goc. We talked about hospice home vs home with hospice or aggressive care. Wishes are for comfort care, no aggressive interventions.  We talked about options. I believe they understand although continue repetitive questions. [name] shared her sister at the bedside has not heard an update. [name] was going call her sister and may call Dr Loleta Books for few more clinical questions. I explained if they choose hospice home and this is one of her "spells" resolves then she can be d/c home from hospice from hospice home. I talked about the end stage of disease progression and from review it appears she is likely at end of life as she continues to decline.   Total time spent 20 minutes Phone discussion 15 minutes Documentation 5 minutes

## 2020-10-25 NOTE — Evaluation (Signed)
Occupational Therapy Evaluation Patient Details Name: Tiffany Ramirez MRN: [id number] DOB: July 04, 1928 Today's Date: 10/25/2020   History of Present Illness 87 y.o. female with medical history significant of DM, Parkinson's disease, HTN has been followed by neurology with last OV 09/14/20.She has been having increased symptoms of dementia with paranoia. For altered mental status she presents to [facility] for evaluation.   Clinical Impression   Patient presenting with decreased Ind in self care, balance, functional mobility/transfers, endurance, and safety awareness. OT able to speak to pt's daughter who reports she has 24/7 caregivers at home and pt lives with husband. At baseline, she uses RW and someone is always with her when mobilizing. She "moves fast". Pt able to perform her own bathing and dressing with caregivers to "check in on her". Her daughter also endorses pt's level of assist varying from day to day.Pt appears very lethargic and placed into chair position in bed with use of lemon swabs and damp cloth to attempt to alert. Pt does clamp mouth closed. Total A to move pt to EOB. Her static sitting balance varies from min guard - max A but she does not open eyes. Pt would likely do best returning home, her familiar environment, once she is medically able to do so with [facility] therapy.Patient will benefit from acute OT to increase overall independence in the areas of ADLs, functional mobility, and safety awareness in order to safely discharge to next venue of care.      Recommendations for follow up therapy are one component of a multi-disciplinary discharge planning process, led by the attending physician.  Recommendations may be updated based on patient status, additional functional criteria and insurance authorization.   Follow Up Recommendations  Home health OT;Supervision/Assistance - 24 hour    Equipment Recommendations  None recommended by OT       Precautions / Restrictions  Precautions Precautions: Fall Precaution Comments: very lethargic      Mobility Bed Mobility Overal bed mobility: Needs Assistance             General bed mobility comments: total A for bed mobility secondary to lethargy with static sitting balance varies from min guard - max A.    Transfers                 General transfer comment: not attempted secondary to safety concerns    Balance Overall balance assessment: Needs assistance Sitting-balance support: Feet supported Sitting balance-Leahy Scale: Poor                                     ADL either performed or assessed with clinical judgement   ADL Overall ADL's : Needs assistance/impaired                                       General ADL Comments: Pt needing total hand over hand assist secondary to lethargy today     Vision Patient Visual Report: No change from baseline              Pertinent Vitals/Pain Pain Assessment: Faces Faces Pain Scale: No hurt     Hand Dominance Right   Extremity/Trunk Assessment Upper Extremity Assessment Upper Extremity Assessment: Difficult to assess due to impaired cognition   Lower Extremity Assessment Lower Extremity Assessment: Difficult to assess due to impaired cognition  Communication Communication Communication: No difficulties   Cognition Arousal/Alertness: Lethargic;Suspect due to medications Behavior During Therapy: Flat affect Overall Cognitive Status: History of cognitive impairments - at baseline                                 General Comments: Pt is very lethargic throughout. She does follow 25% of commands with increased time but keeps eyes closed throughout and does not speak to therapist.              Home Living Family/patient expects to be discharged to:: Private residence Living Arrangements: Spouse/significant other Available Help at Discharge: Family;Available 24 hours/day;Personal  care attendant Type of Home: House Home Access: Level entry     Home Layout: One level     Bathroom Shower/Tub: Astronomer Accessibility: Yes How Accessible: Accessible via walker Home Equipment: [place] - 4 wheels;Bedside commode;Tub bench;Hand held shower head;Grab bars - tub/shower;Other (comment)   Additional Comments: Pt sleeps in sit>stand recliner      Prior Functioning/Environment Level of Independence: Needs assistance  Gait / Transfers Assistance Needed: Pt uses rollator for functional mobility of household distances. Daughter reports pt's level of assist can vary depending on what kind of day she has. She usually moves very fast. ADL's / Homemaking Assistance Needed: Cargivers give medications and provide supervision /assist as needed but pt usually washes and dresses self with someone present for safety. On occasion, she will cook with assist.            OT Problem List: Decreased strength;Decreased activity tolerance;Impaired balance (sitting and/or standing);Decreased safety awareness;Decreased cognition      OT Treatment/Interventions: Self-care/ADL training;Therapeutic exercise;Therapeutic activities;Energy conservation;Cognitive remediation/compensation;DME and/or AE instruction;Patient/family education;Balance training;Manual therapy    OT Goals(Current goals can be found in the care plan section) Acute Rehab OT Goals Patient Stated Goal: none stated OT Goal Formulation: With patient Time For Goal Achievement: 11/08/20 Potential to Achieve Goals: Fair ADL Goals Pt Will Perform Grooming: with min guard assist Pt Will Perform Lower Body Dressing: with min guard assist Pt Will Transfer to Toilet: with min guard assist Pt Will Perform Toileting - Clothing Manipulation and hygiene: with min guard assist  OT Frequency: Min 1X/week   Barriers to D/C:    none known at this time          AM-PAC OT "6 Clicks" Daily Activity     Outcome  Measure Help from another person eating meals?: Total Help from another person taking care of personal grooming?: Total Help from another person toileting, which includes using toliet, bedpan, or urinal?: Total Help from another person bathing (including washing, rinsing, drying)?: Total Help from another person to put on and taking off regular upper body clothing?: Total Help from another person to put on and taking off regular lower body clothing?: Total 6 Click Score: 6   End of Session Nurse Communication: Mobility status;Precautions  Activity Tolerance: Patient limited by lethargy Patient left: in bed;with bed alarm set;with nursing/sitter in room;with call bell/phone within reach  OT Visit Diagnosis: Unsteadiness on feet (R26.81);Muscle weakness (generalized) (M62.81)                Time: 5638-7564 OT Time Calculation (min): 13 min Charges:  OT General Charges $OT Visit: 1 Visit OT Evaluation $OT Eval Moderate Complexity: 1 [address], [place], OTR/L , CBIS ascom [phone]  10/25/20, 10:44 AM

## 2020-10-25 NOTE — Progress Notes (Signed)
Pt refused her 7 AM carbidopa-levodopa (SINEMET IR) dose.

## 2020-10-25 NOTE — Assessment & Plan Note (Signed)
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   Currently catatonic - Stop Seroquel, I do not recommend antipsychotics in Parkinson's patients

## 2020-10-25 NOTE — Progress Notes (Signed)
  Progress Note    Tiffany Ramirez   FTD:[id number]  DOB: [birth year]  DOA: 10/23/2020     0 Date of Service: 10/25/2020     Brief summary: Tiffany Ramirez is a 87 y.o. F with advanced Parkinson's, recalcitrant to treatment, advanced Parkinson's dementia, lives at home, DM, and HTN who presented with paranoid behavior, calling the police.  Family thought she had a UTI.  In the ER she was given Rocephin for UTI, quetiapine and the hospitalist service were asked to evaluate.            Subjective:  Throughout the day today, the patient has been catatonic.  She has had no fever.  She has been nonverbal.  She has a tremor, but no generalized tonic-clonic seizures, no spontaneous verbalizations or movements.  Hospital Problems End of life care Patient admitted for psychotic behavior, has since become catatonic.  Able to follow some simple commands but refuses all medications and PO intake.   Discussed with duaghter/POA who involved husband.  Patient appears to be declining.  Feeding tube, even temporary NG tube would be against the patient's wishes, and given her recent trajectory and difficulty managing symptoms by neurology, would not return her to a reasonable functional status.  - Consult Hospice, anticipate discharge to residential hospice for end of life care - Comfort feeds  Dementia due to Parkinson's disease with behavioral disturbance (Tiffany Ramirez) Psychosis that is refractory to treatment by her primary neurologist.   Currently catatonic - Stop Seroquel, I do not recommend antipsychotics in Parkinson's patients  Parkinson's disease ([place]) - Attempt Sinemet in applesauce if able - Consult Hospice  Essential hypertension - HCTZ if able  Asymptomatic bacteriuria - Stop antibiotics  Paroxysmal atrial fibrillation (HCC) - Continue Plavix if able  Protein-calorie malnutrition, severe As evidenced by severe loss of subcutaneous muscle mass and severe loss of subcutaneous  fat.  Type 2 diabetes mellitus with peripheral angiopathy (HCC) Patient NPO.     Objective Vital signs were reviewed and unremarkable except for: Blood pressure: Elevated  BP (!) 173/91   Pulse (!) 104   Temp 98.1 F (36.7 C) (Oral)   Resp 18   Ht 5\' 6"  (1.676 m)   Wt 49.6 kg   SpO2 96%   BMI 17.65 kg/m      Exam General appearance: Frail elderly female, lying in bed, eyes closed.     HEENT: Anicteric, conjunctival pink, lids and lashes normal.  Pupils equal round and reactive, makes eye contact, no nasal forming, discharge, or epistaxis.  Oropharynx dry, lips normal, dentition in poor repair Skin: Tented, no suspicious rashes or lesions Cardiac: RRR, no murmurs, no lower extremity edema, no JVD Respiratory: Normal respiratory rate and rhythm, lungs clear without rales or wheezes Abdomen: No obvious guarding, no distention. MSK: Severely reduced subcutaneous muscle mass, severely reduced subcutaneous fat, thenar wasting, temporal wasting Neuro: Pupils equal round and reactive, makes eye contact, lifts both arms weakly, but symmetrically.  No spontaneous verbalizations.  Does follow simple commands, does not answer questions. tremor noted Psych: Unresponsive    Labs / Other Information My review of labs, imaging, notes and other tests is significant for White blood cell count low normal, hemoglobin 10.  Otherwise electrolytes and renal function normal.     Time spent: 35 minutes [facility] 10/25/2020, 8:45 PM

## 2020-10-25 NOTE — Assessment & Plan Note (Signed)
-   Attempt Sinemet in applesauce if able - Consult Hospice

## 2020-10-25 NOTE — Progress Notes (Signed)
Initial Nutrition Assessment  DOCUMENTATION CODES:   Severe malnutrition in context of chronic illness  INTERVENTION:   Ensure Enlive po BID, each supplement provides 350 kcal and 20 grams of protein  Magic cup TID with meals, each supplement provides 290 kcal and 9 grams of protein  Dysphagia 3 diet   Pt at high refeed risk; recommend monitor potassium, magnesium and phosphorus labs daily until stable  NUTRITION DIAGNOSIS:   Severe Malnutrition related to chronic illness (Parkinson's demenia) as evidenced by moderate fat depletion, moderate muscle depletion, severe muscle depletion.  GOAL:   Patient will meet greater than or equal to 90% of their needs  MONITOR:   PO intake, Supplement acceptance, Labs, Weight trends, Skin, I & O's  REASON FOR ASSESSMENT:   Malnutrition Screening Tool    ASSESSMENT:   87 y/o female with h/o Parkinson's disease, dementia, DM, depression, HTN and iron deficiency anemia who presents with UTI and AMS.  Visited pt's room today. Pt non-responsive and unable to provide any nutrition related history. Pt with severe malnutrition. RD suspects pt with poor appetite and oral intake at baseline. Pt has not been awake enough to eat anything today. Sitter is at bedside. Per chart, pt is down ~32lbs(23%) over the past two years; RD unsure how recently weight loss occurred. RD will add supplements to help pt meet her estimated needs. RD will also change pt to a mechanical soft diet. Pt is likely at refeed risk. Palliative care following; family is considering hospice.   Medications reviewed and include: plavix, lovenox, protonix, KCl, NaCl w/ KCl @100ml /hr, ceftriaxone   Labs reviewed: K 3.6 wnl Wbc- 3.4(L), Hgb 10.0(L), Hct 30.7(L), MCV 78.5(L), MCH 25.6(L)  NUTRITION - FOCUSED PHYSICAL EXAM:  Flowsheet Row Most Recent Value  Orbital Region Moderate depletion  Upper Arm Region Moderate depletion  Thoracic and Lumbar Region Severe depletion  Buccal  Region Moderate depletion  Temple Region Severe depletion  Clavicle Bone Region Severe depletion  Clavicle and Acromion Bone Region Severe depletion  Scapular Bone Region Severe depletion  Dorsal Hand Severe depletion  Patellar Region Severe depletion  Anterior Thigh Region Severe depletion  Posterior Calf Region Severe depletion  Edema (RD Assessment) None  Hair Reviewed  Eyes Reviewed  Mouth Reviewed  Skin Reviewed  Nails Reviewed   Diet Order:   Diet Order             Diet regular Room service appropriate? Yes; Fluid consistency: Thin  Diet effective now                  EDUCATION NEEDS:   No education needs have been identified at this time  Skin:  Skin Assessment: Reviewed RN Assessment (ecchymosis)  Last BM:  10/2- type 3  Height:   Ht Readings from Last 1 Encounters:  10/23/20 5\' 6"  (1.676 m)    Weight:   Wt Readings from Last 1 Encounters:  10/23/20 49.6 kg    Ideal Body Weight:  59 kg  BMI:  Body mass index is 17.65 kg/m.  Estimated Nutritional Needs:   Kcal:  1300-1500kcal/day  Protein:  65-75g/day  Fluid:  1.3-1.4L/day  Koleen Distance MS, RD, LDN Please refer to [facility] for RD and/or RD on-call/weekend/after hours pager

## 2020-10-25 NOTE — Hospital Course (Addendum)
Tiffany Ramirez is a 87 y.o. F with advanced Parkinson's, recalcitrant to treatment, advanced Parkinson's dementia, lives at home, DM, and HTN who presented with paranoid behavior, calling the police.  Family thought she had a UTI.  In the ER she was given Rocephin for UTI, quetiapine and the hospitalist service were asked to evaluate.  Admitted and started on IV fluids, antibiotics.  By hospital day 2, patient was catatonic, eyes closed.  Would make eye contact and follow commands if eyes were manually opened by provider, but otherwise eyes closed and unresponsive.  Spit all medicines out.  Temporary feeding tube discussed, family were aware this was strictly against her wishes, deferred.  In absence of feeding tube, Sinemet and other home medications were repeatedly offered, patient repeatedly refused and spit out.  Hospice consulted, and residential hospice was recommended.

## 2020-10-25 NOTE — Assessment & Plan Note (Signed)
-   HCTZ if able

## 2020-10-25 NOTE — Assessment & Plan Note (Signed)
-   Continue Plavix if able

## 2020-10-25 NOTE — Progress Notes (Signed)
Sinemet PO crushed attempted after patient successfully swallowed ice chip.  Patient followed command to open mouth but when medication put in mouth she pushed it out with her tongue. Medication with applesauce placed in mouth however patient held it in her mouth and did not swallow and eventually pushed it out of her mouth again. PO medication unsuccessful due to impaired cognition.

## 2020-10-25 NOTE — Progress Notes (Signed)
[facility]) Hospital Liaison note:  This patient is currently enrolled in [facility] outpatient-based Palliative Care. Will continue to follow for disposition.  Please call with any outpatient palliative questions or concerns.  Thank you, Lorelee Market, LPN [facility] Liaison [phone]

## 2020-10-26 DIAGNOSIS — Z515 Encounter for palliative care: Secondary | ICD-10-CM | POA: Diagnosis not present

## 2020-10-26 DIAGNOSIS — I10 Essential (primary) hypertension: Secondary | ICD-10-CM | POA: Diagnosis not present

## 2020-10-26 DIAGNOSIS — G20 Parkinson's disease: Secondary | ICD-10-CM | POA: Diagnosis not present

## 2020-10-26 DIAGNOSIS — I480 Paroxysmal atrial fibrillation: Secondary | ICD-10-CM | POA: Diagnosis not present

## 2020-10-26 LAB — URINE CULTURE: Culture: NO GROWTH

## 2020-10-26 NOTE — Progress Notes (Signed)
OT Cancellation Note  Patient Details Name: LASHAWNE DURA MRN: [id number] DOB: [birth year]   Cancelled Treatment:    Reason Eval/Treat Not Completed: Other (comment) Pt to transition to hospice home once bed available. Will complete OT orders at this time. Thank you for allowing OT services to participate in the care of this patient.  Gerrianne Scale, [place], OTR/L ascom [phone] 10/26/20, 9:44 AM

## 2020-10-26 NOTE — Progress Notes (Signed)
[facility]) Hospital Liaison Note   Hospice Home is able to offer a bed today.    Family agreeable to transfer today. Transport set up with [facility] for 5 pm. Isaias Cowman, RN [facility] Manager aware.   RN please call report to [place] at [phone] prior to patient leaving the unit.  Please send signed and completed DNR with patient at discharge.   Please do not hesitate to call with any hospice related questions.    Thank you for the opportunity to participate in this patient's care.   Bobbie "Loren Racer, RN, BSN [facility] Liaison [phone]

## 2020-10-26 NOTE — Assessment & Plan Note (Signed)
Attempt Sinemet in applesauce if able

## 2020-10-26 NOTE — Discharge Summary (Signed)
Physician Discharge Summary   Patient name: Tiffany Ramirez  Admit date:     10/23/2020  Discharge date: 10/26/2020  Attending Physician: Edwin Dada [[id number]]  Discharge Physician: Edwin Dada   PCP: Rusty Aus, MD     Primary discharge diagnosis End stage Parkinson's disease  Discharge Diagnoses   End of life care   Essential hypertension   Parkinson's disease ([place])   Dementia due to Parkinson's disease with behavioral disturbance ([place])   Paroxysmal atrial fibrillation ([place])   Asymptomatic bacteriuria   Type 2 diabetes mellitus with peripheral angiopathy ([place])   Protein-calorie malnutrition, severe   GERD (gastroesophageal reflux disease)       Hospital Course   Tiffany Ramirez is a 87 y.o. F with advanced Parkinson's, recalcitrant to treatment, advanced Parkinson's dementia, lives at home, DM, and HTN who presented with paranoid behavior, calling the police.  Family thought she had a UTI.  In the ER she was given Rocephin for UTI, quetiapine and the hospitalist service were asked to evaluate.  Admitted and started on IV fluids, antibiotics.  By hospital day 2, patient was catatonic, eyes closed.  Would make eye contact and follow commands if eyes were manually opened by provider, but otherwise eyes closed and unresponsive.  Spit all medicines out.  Temporary feeding tube discussed, family were aware this was strictly against her wishes, deferred.  In absence of feeding tube, Sinemet and other home medications were repeatedly offered, patient repeatedly refused and spit out.  Hospice consulted, and residential hospice was recommended.        End of life care Patient admitted for psychotic behavior, subsequently became catatonic. Refused all medications and PO intake.   Feeding tube, even temporary NG tube would be against the patient's wishes, and given her recent trajectory and prolonged difficulty managing symptoms by neurology, her  recent high symptom burden, it is unlikely she will return to a reasonable functional status or quality of life.  Hospice were consulted, in accordance with patient's wishes.   Dementia due to Parkinson's disease with behavioral disturbance ([place]) Psychosis that is refractory to treatment by her primary neurologist.      Parkinson's disease ([place]) Attempt Sinemet in applesauce if able    Essential hypertension    Asymptomatic bacteriuria    Paroxysmal atrial fibrillation (HCC)    Protein-calorie malnutrition, severe As evidenced by severe loss of subcutaneous muscle mass and severe loss of subcutaneous fat.  Type 2 diabetes mellitus with peripheral angiopathy (HCC)    GERD (gastroesophageal reflux disease)           Condition at discharge: worsening  Exam Physical Exam Constitutional:      Comments: Eyes open, awake, but makes no sensical responses  HENT:     Mouth/Throat:     Comments: Lips dry.  OP appears tacky dry. Cardiovascular:     Rate and Rhythm: Normal rate. Rhythm irregular.     Heart sounds: No murmur heard. Pulmonary:     Effort: Pulmonary effort is normal.     Breath sounds: No wheezing or rales.  Abdominal:     General: Abdomen is flat.     Palpations: Abdomen is soft.     Tenderness: There is no abdominal tenderness.  Musculoskeletal:        General: No deformity.     Comments: Severe diffuse loss of subcutaneous muscle mass and fat  Neurological:     Mental Status: She is confused.     Motor: Weakness  and tremor present.     Comments: Makes eye contact, follows some simple commands with both hands, but no sensible verbalizations.  Psychiatric:        Attention and Perception: She is inattentive.        Speech: She is noncommunicative. Speech is delayed.        Behavior: Behavior is uncooperative.        Cognition and Memory: Cognition is impaired.      Disposition: Hospice care  Discharge time: less than 30 minutes. Allergies as of  10/26/2020       Reactions   Codeine    Other reaction(s): Other (See Comments) Sick on her stomach   Phenobarbital Other (See Comments)   Overall restlessness   Other Anxiety   Benadryal        Medication List     STOP taking these medications    clopidogrel 75 MG tablet Commonly known as: PLAVIX   famotidine 40 MG tablet Commonly known as: PEPCID   Inbrija 42 MG Caps Generic drug: Levodopa   nitrofurantoin (macrocrystal-monohydrate) 100 MG capsule Commonly known as: MACROBID   potassium chloride 10 MEQ tablet Commonly known as: KLOR-CON   PROBIOTIC PO   rOPINIRole 4 MG 24 hr tablet Commonly known as: REQUIP XL   simvastatin 20 MG tablet Commonly known as: ZOCOR   sodium phosphate Pediatric 3.5-9.5 GM/59ML enema   sucralfate 1 GM/10ML suspension Commonly known as: CARAFATE       TAKE these medications    alum & mag hydroxide-simeth 096-283-66 MG/5ML suspension Commonly known as: Maalox Max Take 5 mLs by mouth every 6 (six) hours as needed for indigestion.   carbidopa-levodopa 25-100 MG tablet Commonly known as: SINEMET IR Take 1 tablet by mouth 6 (six) times daily. At 7, 10, 1 PM, 4 PM, 7 PM, and 10 PM.   carbidopa-levodopa 50-200 MG tablet Commonly known as: SINEMET CR Take 1 tablet by mouth at bedtime.   dexlansoprazole 60 MG capsule Commonly known as: DEXILANT Take by mouth.   gabapentin 300 MG capsule Commonly known as: NEURONTIN   hydrochlorothiazide 12.5 MG tablet Commonly known as: HYDRODIURIL Take 12.5 mg by mouth daily.   linaclotide 145 MCG Caps capsule Commonly known as: LINZESS Take 1 capsule (145 mcg total) by mouth daily before breakfast.   midodrine 2.5 MG tablet Commonly known as: PROAMATINE Take 2.5 mg by mouth 2 (two) times daily.   ondansetron 4 MG disintegrating tablet Commonly known as: Zofran ODT Take 1 tablet (4 mg total) by mouth every 8 (eight) hours as needed.   ondansetron 8 MG tablet Commonly known as:  ZOFRAN TAKE 1 TABLET (8 MG TOTAL) BY MOUTH EVERY 4 (FOUR) HOURS AS NEEDED FOR NAUSEA FOR UP TO 20 DAYS   pantoprazole 20 MG tablet Commonly known as: PROTONIX Take 40 mg by mouth daily.        No results found. Results for orders placed or performed during the hospital encounter of 10/23/20  Urine Culture     Status: Abnormal   Collection Time: 10/24/20 12:34 AM   Specimen: Urine, Clean Catch  Result Value Ref Range Status   Specimen Description   Final    URINE, CLEAN CATCH Performed at [facility], [address], [place], [place] [address]    Special Requests   Final    NONE Performed at [facility], [address] [place], [place] [address]    Culture MULTIPLE SPECIES PRESENT, SUGGEST RECOLLECTION (A)  Final  Report Status 10/25/2020 FINAL  Final  Resp Panel by RT-PCR (Flu A&B, Covid) Nasopharyngeal Swab     Status: None   Collection Time: 10/24/20  8:19 AM   Specimen: Nasopharyngeal Swab; Nasopharyngeal(NP) swabs in vial transport medium  Result Value Ref Range Status   SARS Coronavirus 2 by RT PCR NEGATIVE NEGATIVE Final    Comment: (NOTE) SARS-CoV-2 target nucleic acids are NOT DETECTED.  The SARS-CoV-2 RNA is generally detectable in upper respiratory specimens during the acute phase of infection. The lowest concentration of SARS-CoV-2 viral copies this assay can detect is 138 copies/mL. A negative result does not preclude SARS-Cov-2 infection and should not be used as the sole basis for treatment or other patient management decisions. A negative result may occur with  improper specimen collection/handling, submission of specimen other than nasopharyngeal swab, presence of viral mutation(s) within the areas targeted by this assay, and inadequate number of viral copies(<138 copies/mL). A negative result must be combined with clinical observations, patient history, and epidemiological information. The expected result is Negative.  Fact  Sheet for Patients:  EntrepreneurPulse.com.au  Fact Sheet for Healthcare Providers:  IncredibleEmployment.be  This test is no t yet approved or cleared by the [place] FDA and  has been authorized for detection and/or diagnosis of SARS-CoV-2 by FDA under an Emergency Use Authorization (EUA). This EUA will remain  in effect (meaning this test can be used) for the duration of the COVID-19 declaration under Section 564(b)(1) of the Act, 21 U.S.C.section 360bbb-3(b)(1), unless the authorization is terminated  or revoked sooner.       Influenza A by PCR NEGATIVE NEGATIVE Final   Influenza B by PCR NEGATIVE NEGATIVE Final    Comment: (NOTE) The Xpert Xpress SARS-CoV-2/FLU/RSV plus assay is intended as an aid in the diagnosis of influenza from Nasopharyngeal swab specimens and should not be used as a sole basis for treatment. Nasal washings and aspirates are unacceptable for Xpert Xpress SARS-CoV-2/FLU/RSV testing.  Fact Sheet for Patients: EntrepreneurPulse.com.au  Fact Sheet for Healthcare Providers: IncredibleEmployment.be  This test is not yet approved or cleared by the [place] FDA and has been authorized for detection and/or diagnosis of SARS-CoV-2 by FDA under an Emergency Use Authorization (EUA). This EUA will remain in effect (meaning this test can be used) for the duration of the COVID-19 declaration under Section 564(b)(1) of the Act, 21 U.S.C. section 360bbb-3(b)(1), unless the authorization is terminated or revoked.  Performed at [facility], [address] [place], [place] [address]     Signed:  Edwin Dada MD.  [facility] 10/26/2020, 10:10 AM

## 2020-10-26 NOTE — Care Plan (Signed)
Patient discharged with foley in place, to residential hospice.

## 2020-10-26 NOTE — Assessment & Plan Note (Signed)
Patient admitted for psychotic behavior, subsequently became catatonic. Refused all medications and PO intake.   Feeding tube, even temporary NG tube would be against the patient's wishes, and given her recent trajectory and prolonged difficulty managing symptoms by neurology, her recent high symptom burden, it is unlikely she will return to a reasonable functional status or quality of life.  Hospice were consulted, in accordance with patient's wishes.

## 2020-10-26 NOTE — Progress Notes (Signed)
PT Cancellation Note  Patient Details Name: CLEMENCE LENGYEL MRN: [id number] DOB: [birth year]   Cancelled Treatment:    Reason Eval/Treat Not Completed: Medical issues which prohibited therapy (Per chart review, patient/family pursuing comfort care, pending transition to hospice home.  Will complete order at this time; please re-consult should goals of care change.)  Clarrisa Kaylor H. Owens Shark, PT, DPT, NCS 10/26/20, 9:50 AM [phone]

## 2020-10-26 NOTE — Progress Notes (Signed)
Report called to Hospice home and given to nurse Colletta [place], pt currently awaiting transport and NAD noted.

## 2020-10-26 NOTE — Assessment & Plan Note (Signed)
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

## 2020-10-26 NOTE — TOC Initial Note (Signed)
Transition of Care [facility]) - Initial/Assessment Note    Patient Details  Name: Tiffany Ramirez MRN: [id number] Date of Birth: [birth year]  Transition of Care [facility]) CM/SW Contact:    Beverly Sessions, RN Phone Number: 10/26/2020, 12:27 PM  Clinical Narrative:                 Shawn Stall, NP made referral for residential hospice home Patient to discharge today Sonia Baller with [employer] has arranged EMS transport for 5 pm EMS packed and signed DNR on chart  Expected Discharge Plan: [place] Barriers to Discharge: Barriers Resolved   Patient Goals and CMS Choice        Expected Discharge Plan and Services Expected Discharge Plan: [place]         Expected Discharge Date: 10/26/20                                    Prior Living Arrangements/Services                       Activities of Daily Living Home Assistive Devices/Equipment: Eyeglasses ADL Screening (condition at time of admission) Patient's cognitive ability adequate to safely complete daily activities?: No Is the patient deaf or have difficulty hearing?: Yes Does the patient have difficulty seeing, even when wearing glasses/contacts?: Yes Does the patient have difficulty concentrating, remembering, or making decisions?: Yes Patient able to express need for assistance with ADLs?: No Does the patient have difficulty dressing or bathing?: Yes Independently performs ADLs?: No Communication: Independent Dressing (OT): Needs assistance Is this a change from baseline?: Pre-admission baseline Grooming: Needs assistance Is this a change from baseline?: Pre-admission baseline Feeding: Independent Bathing: Needs assistance Is this a change from baseline?: Pre-admission baseline Toileting: Needs assistance Is this a change from baseline?: Pre-admission baseline In/Out Bed: Needs assistance Is this a change from baseline?: Pre-admission baseline Walks in  Home: Needs assistance Is this a change from baseline?: Pre-admission baseline Does the patient have difficulty walking or climbing stairs?: Yes Weakness of Legs: Both Weakness of Arms/Hands: None  Permission Sought/Granted                  Emotional Assessment              Admission diagnosis:  UTI (urinary tract infection) [N39.0] Dementia with agitation, unspecified dementia severity, unspecified dementia type [F03.911] End of life care [Z51.5] Patient Active Problem List   Diagnosis Date Noted   GERD (gastroesophageal reflux disease) 10/25/2020   Protein-calorie malnutrition, severe 10/25/2020   End of life care 10/25/2020   Dementia due to Parkinson's disease with behavioral disturbance ([place]) 10/25/2020   Asymptomatic bacteriuria 10/24/2020   Syncope 09/11/2020   Weakness 09/11/2020   Lymphedema 04/04/2020   Chronic venous insufficiency 04/04/2020   Atypical chest pain 06/14/2018   Paroxysmal atrial fibrillation ([place]) 06/14/2018   Sinus bradycardia 06/13/2018   Iron malabsorption 05/08/2018   Iron deficiency anemia due to chronic blood loss 05/07/2018   Shortness of breath 05/07/2018   Type 2 diabetes mellitus with peripheral angiopathy ([place]) 08/22/2017   Medicare annual wellness visit, initial 02/15/2016   Swelling of limb 12/28/2015   Chronic hyperglycemia 08/13/2014   Parkinson's disease ([place]) 08/28/2013   Hyperlipidemia, mixed 07/28/2013   Arterial vascular disease 07/28/2013   Insomnia with sleep apnea    Sleep disorder    Essential hypertension  PCP:  Rusty Aus, MD Pharmacy:   [id number] - [place], [place] [address] Phone: [phone] Fax: [phone]  [facility] [place], [place] [address] Phone: [phone] Fax: [phone]  [facility]  ([place], [facility] [address] [place] [address] Phone: [phone] Fax: [phone]     Social Determinants of Health (SDOH) Interventions    Readmission Risk Interventions No flowsheet data found.

## 2020-10-26 NOTE — Assessment & Plan Note (Signed)
As evidenced by severe loss of subcutaneous muscle mass and severe loss of subcutaneous fat.

## 2020-11-23 DEATH — deceased

## 2022-05-04 IMAGING — CT CT HEAD W/O CM
3 series · 16 of 47 positions shown, 19 images · non-contrast
Comparison: Brain MRI 09/11/2020.  Head CT 09/12/2020.

CLINICAL DATA: [AGE] female status post fall.

EXAM:
CT HEAD WITHOUT CONTRAST
TECHNIQUE: Contiguous axial images were obtained from the base of the skull
through the vertex without intravenous contrast.

[Series 3: head wo · axial · 0.43mm/px · z∈[+226,+356]mm · 10 of 32 slices shown, 13 images]
[im 3/32  brain]
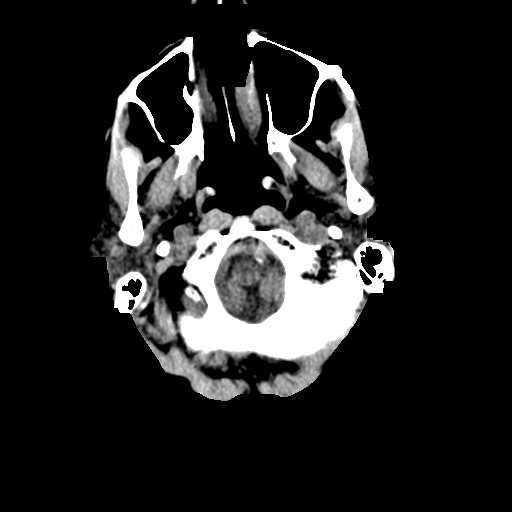
[im 3/32  bone]
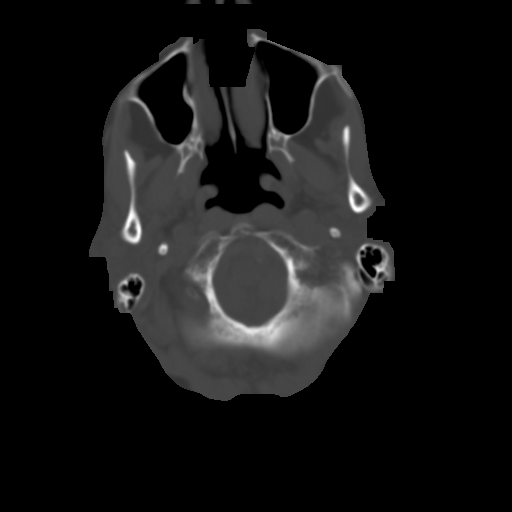
[im 6/32  brain]
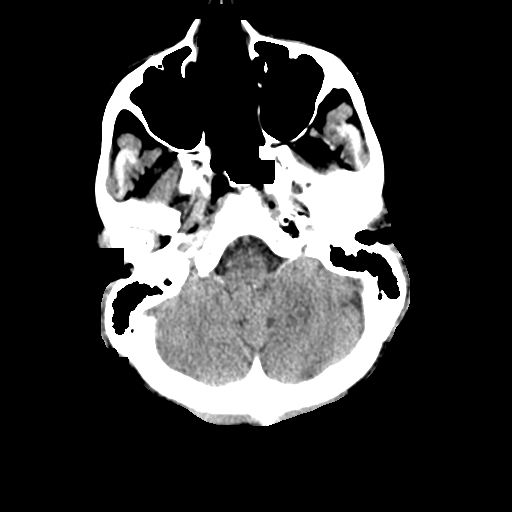
[im 9/32  brain]
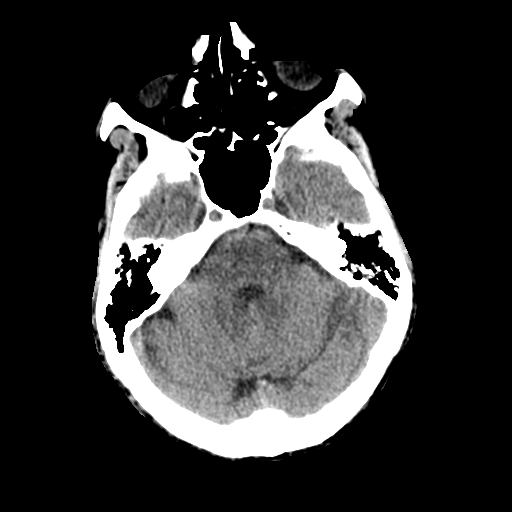
[im 11/32  brain]
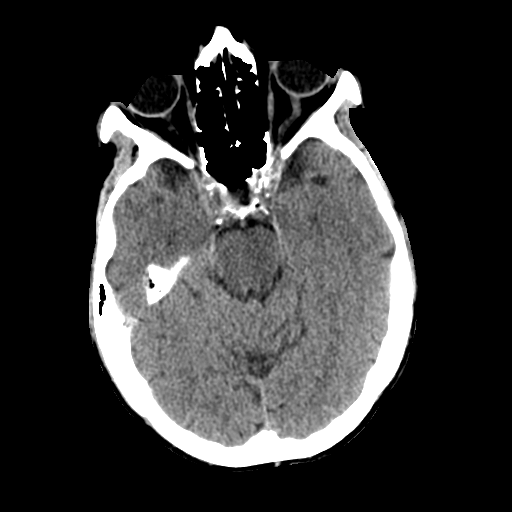
[im 14/32  brain]
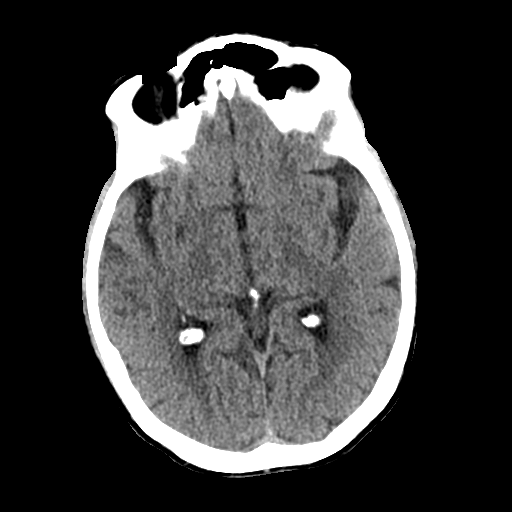
[im 14/32  bone]
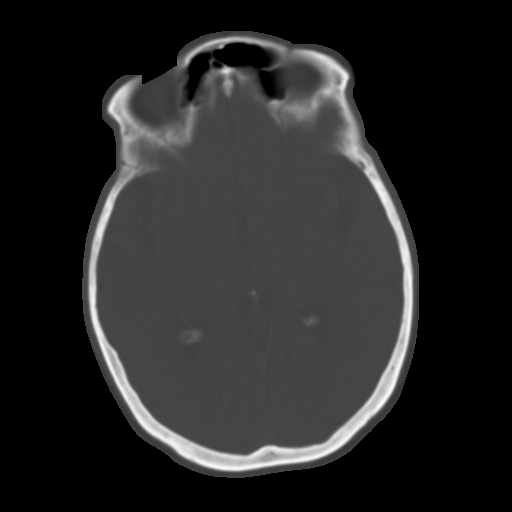
[im 18/32  brain]
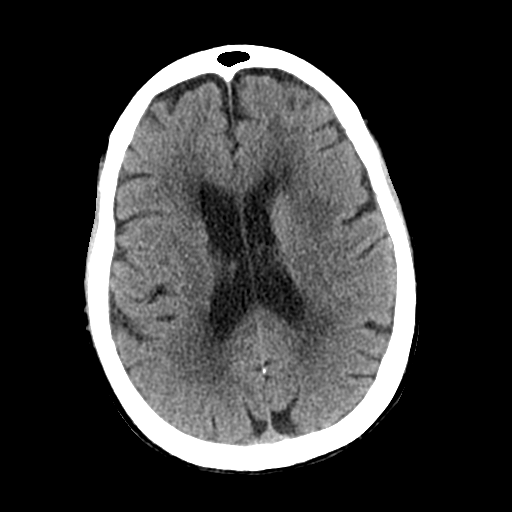
[im 21/32  brain]
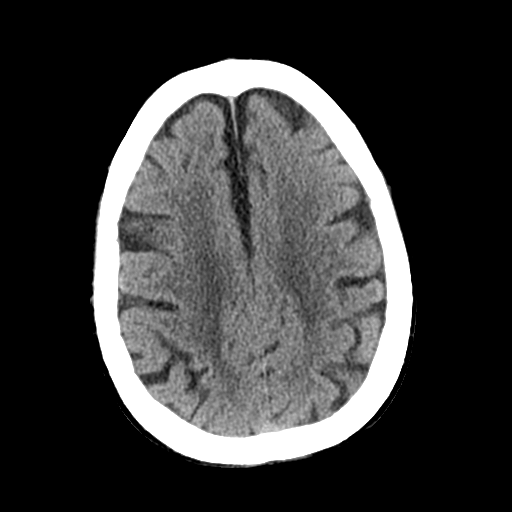
[im 24/32  brain]
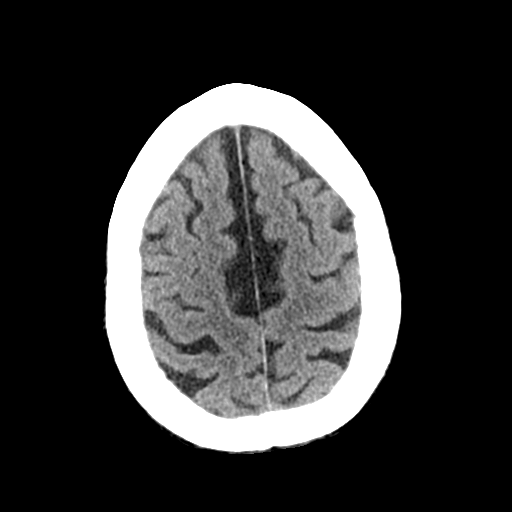
[im 26/32  brain]
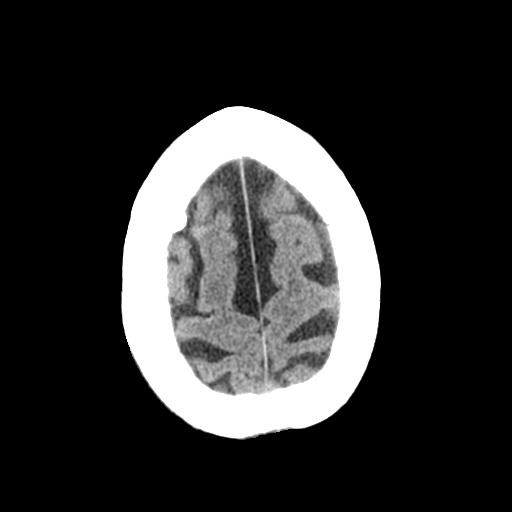
[im 26/32  bone]
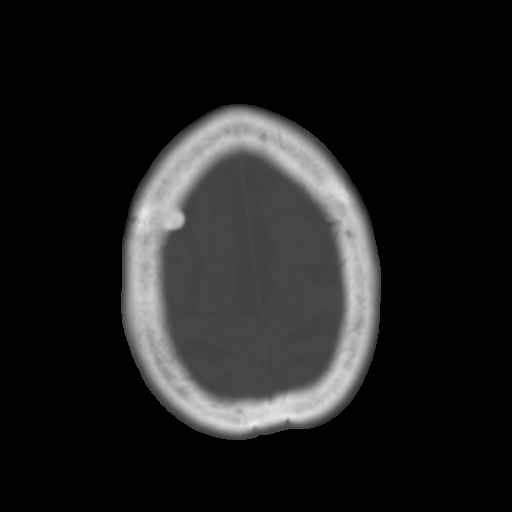
[im 29/32  brain]
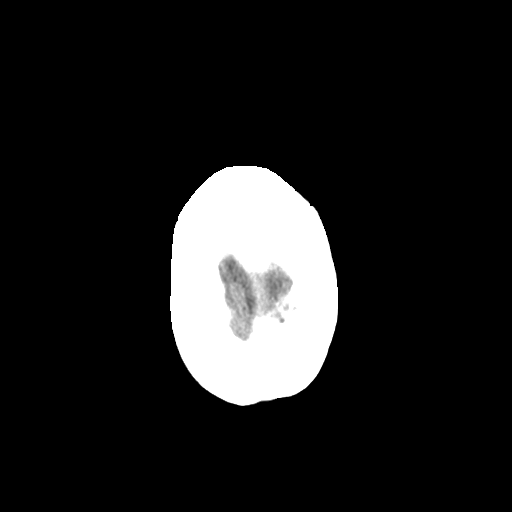

[Series 4: coronal soft tissue · coronal · 0.33mm/px · 3 of 69 slices shown]
[im 23/69  brain]
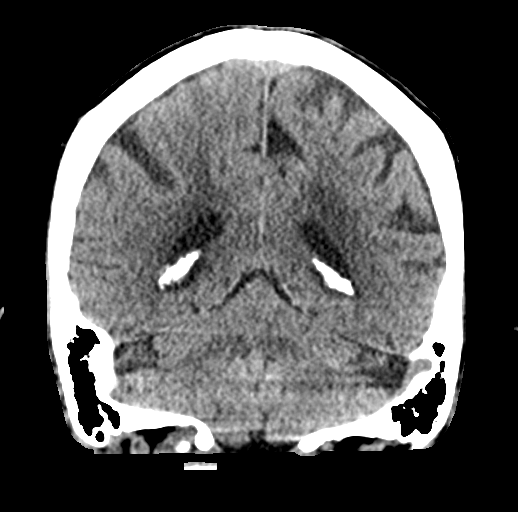
[im 31/69  brain]
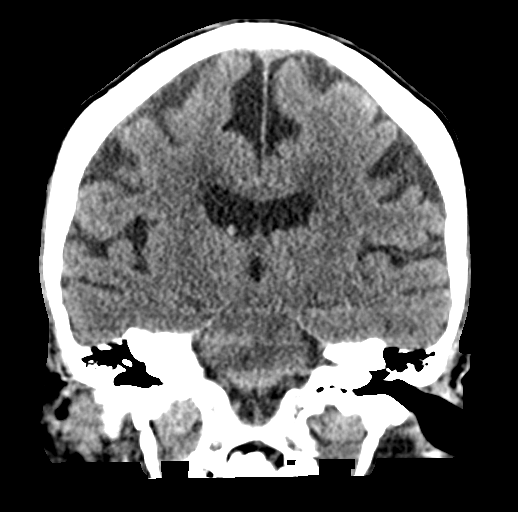
[im 38/69  brain]
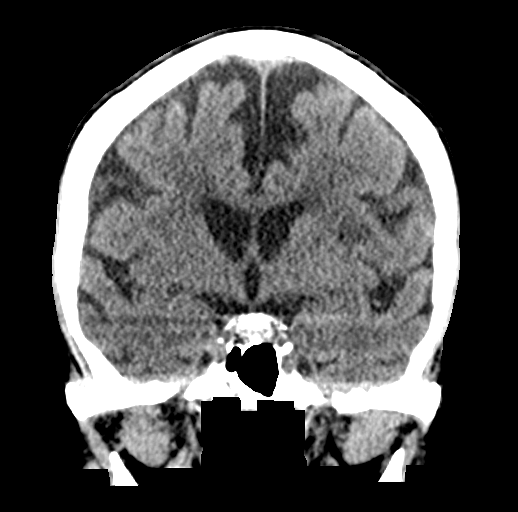

[Series 5: sagittal soft tissue · sagittal · 0.32mm/px · 3 of 58 slices shown]
[im 20/58  brain]
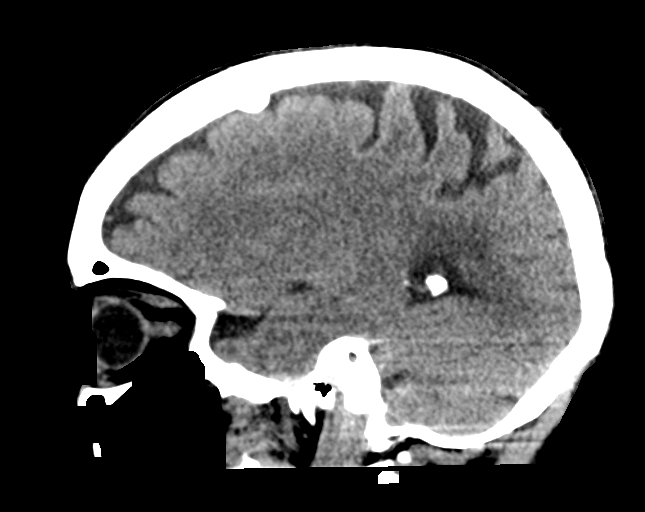
[im 29/58  brain]
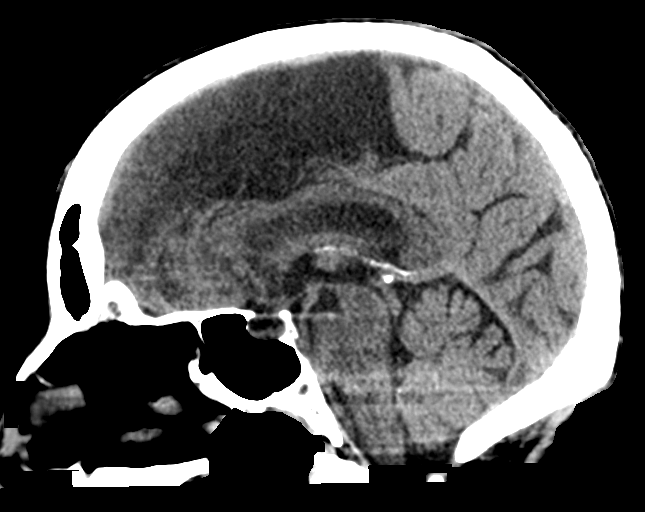
[im 39/58  brain]
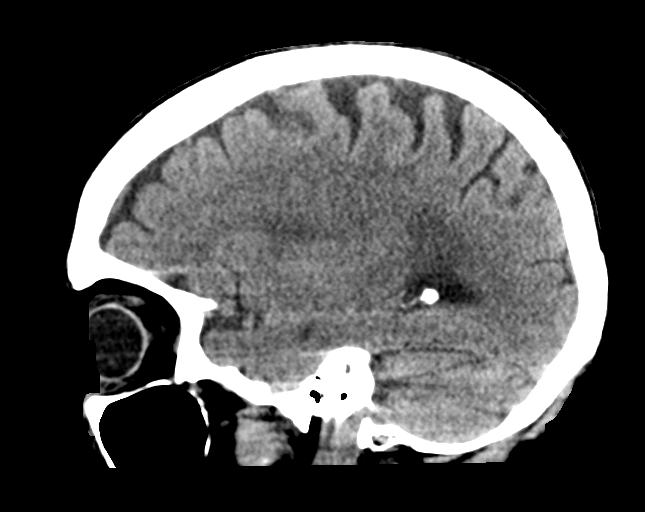

[16 of 47 positions shown; findings below may reference images not displayed]

FINDINGS: Brain: No midline shift, ventriculomegaly, mass effect, evidence of
mass lesion, intracranial hemorrhage or evidence of cortically based
acute infarction. Stable Patchy and confluent bilateral white matter
hypodensity.

Vascular: Calcified atherosclerosis at the skull base. No suspicious
intracranial vascular hyperdensity.

Skull: Stable, intact.

Sinuses/Orbits: Visualized paranasal sinuses and mastoids are stable
and well aerated.

Other: No acute orbit or scalp soft tissue finding.
IMPRESSION: Stable. No acute intracranial abnormality or acute traumatic injury
identified.

## 2022-05-04 IMAGING — CT CT CERVICAL SPINE W/O CM
2 series · 11 of 27 positions shown, 14 images · non-contrast
Comparison: Head CT today. Brain MRI 09/11/2020. chest CTA
05/14/2018.

CLINICAL DATA: [AGE] female status post fall.

EXAM:
CT CERVICAL SPINE WITHOUT CONTRAST
TECHNIQUE: Multidetector CT imaging of the cervical spine was performed without
intravenous contrast. Multiplanar CT image reconstructions were also
generated.

[Series 3: c spine soft · axial · 0.37mm/px · z∈[+108,+228]mm · 6 of 78 slices shown, 8 images]
[im 12/78  soft-tissue]
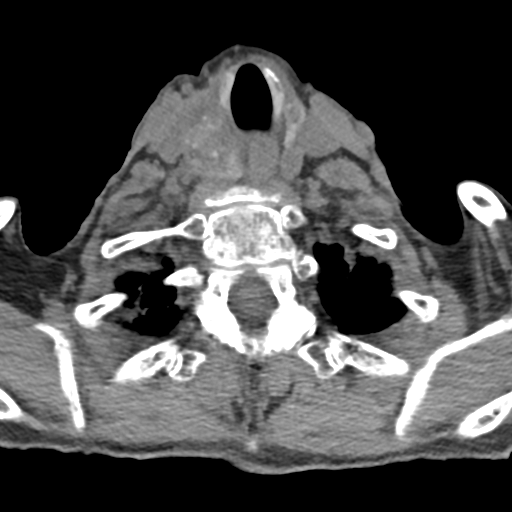
[im 12/78  bone]
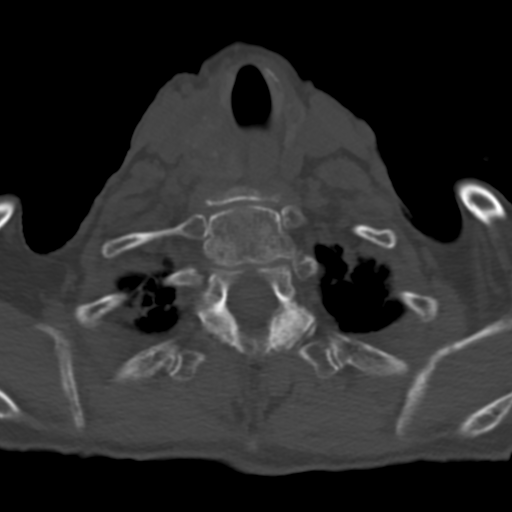
[im 24/78  bone]
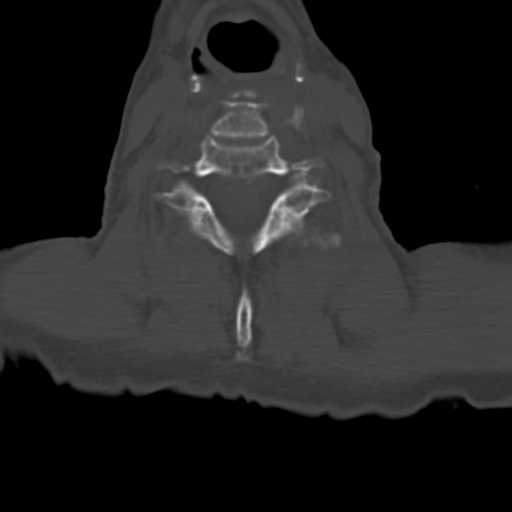
[im 36/78  bone]
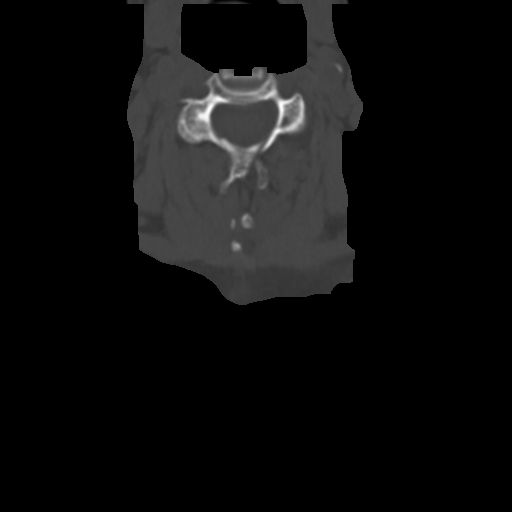
[im 48/78  bone]
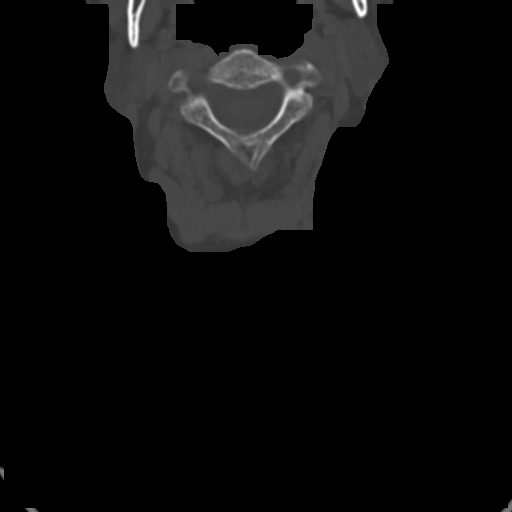
[im 60/78  soft-tissue]
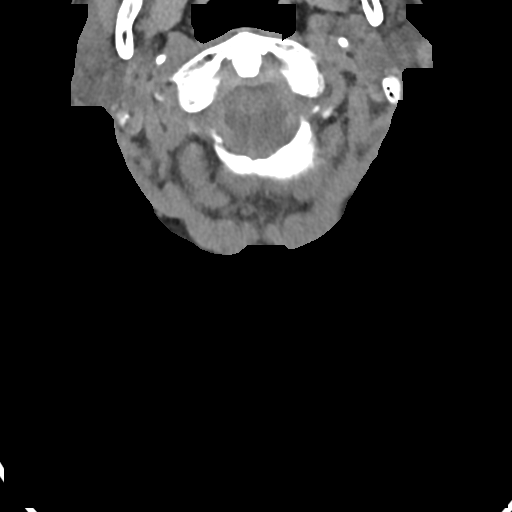
[im 60/78  bone]
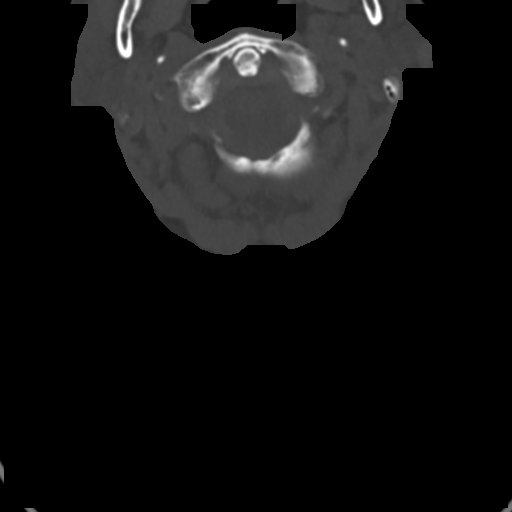
[im 72/78  bone]
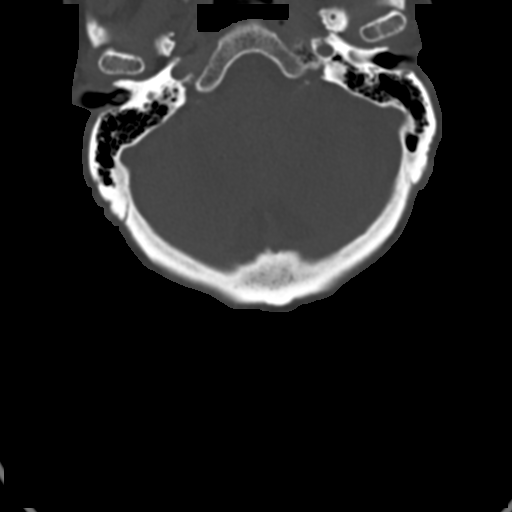

[Series 6: sagittal bone · sagittal · 0.24mm/px · 5 of 68 slices shown, 6 images]
[im 23/68  bone]
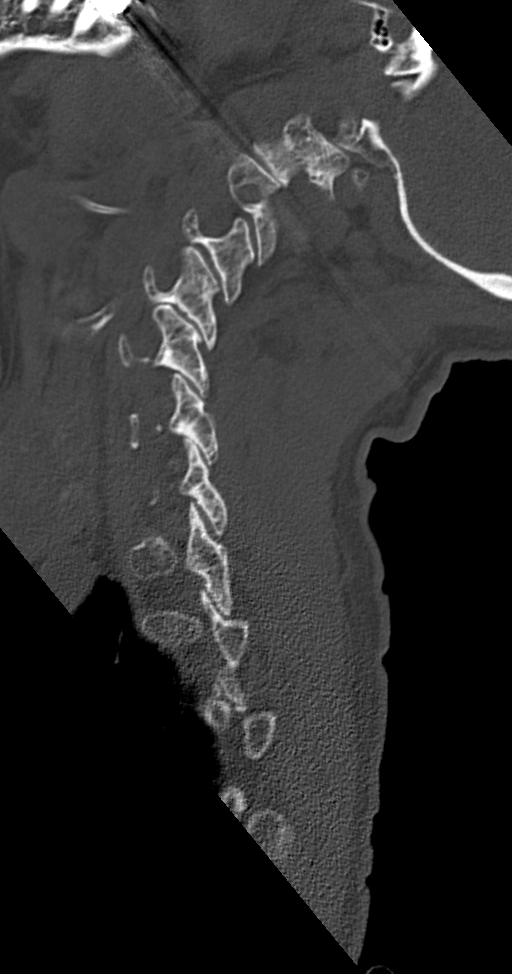
[im 28/68  bone]
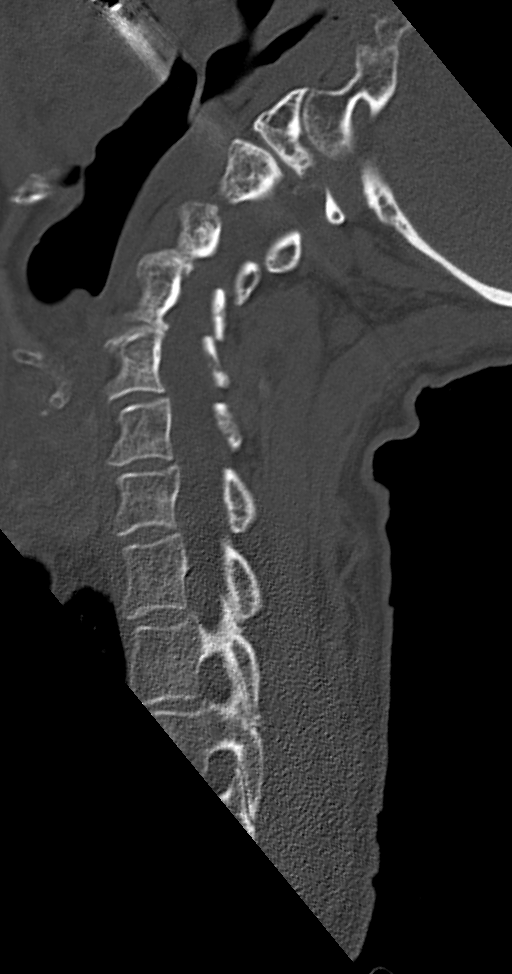
[im 34/68  soft-tissue]
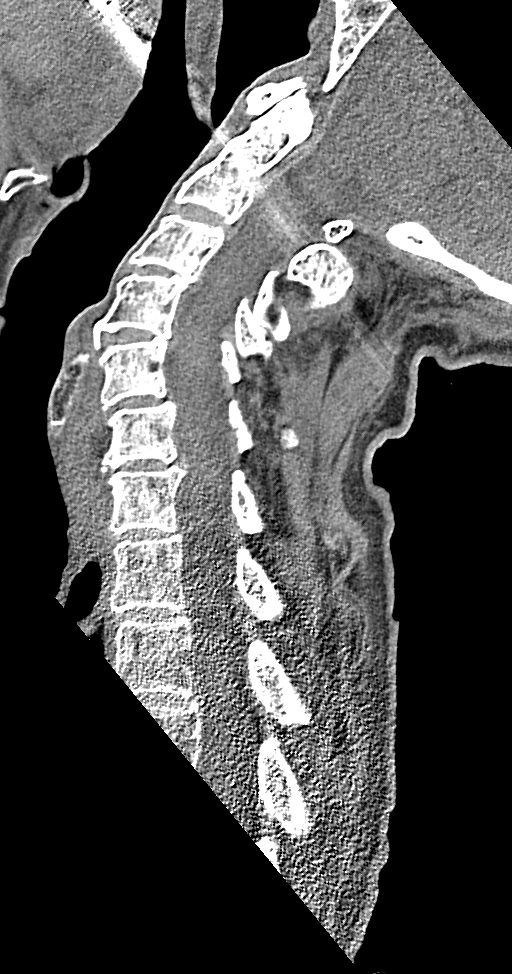
[im 34/68  bone]
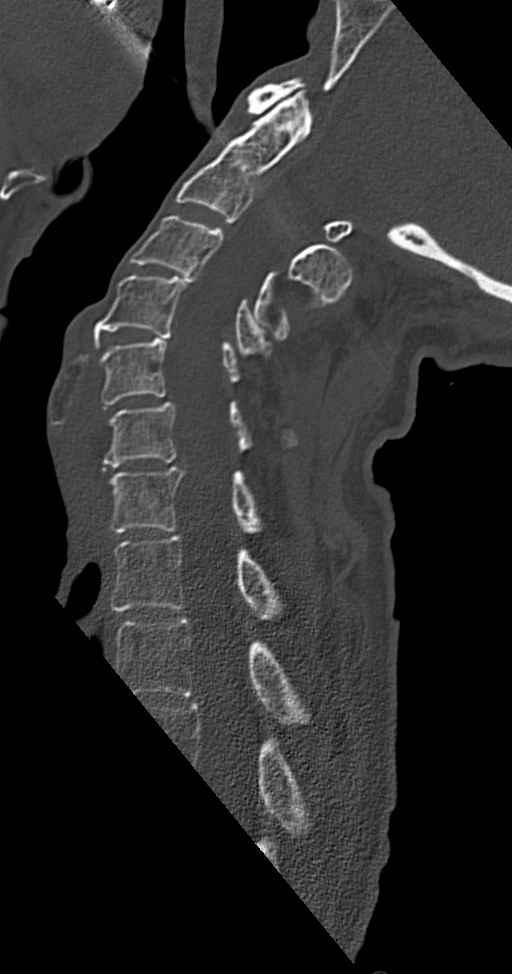
[im 40/68  bone]
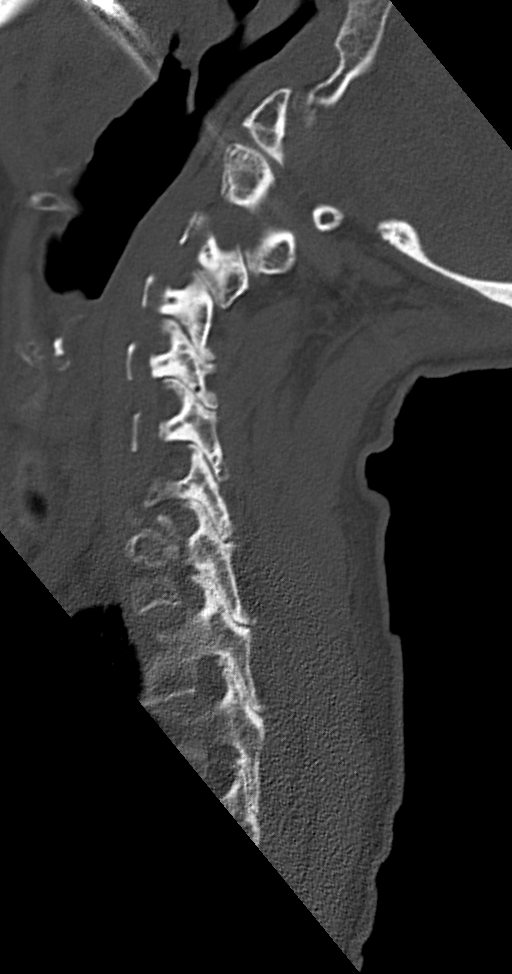
[im 45/68  bone]
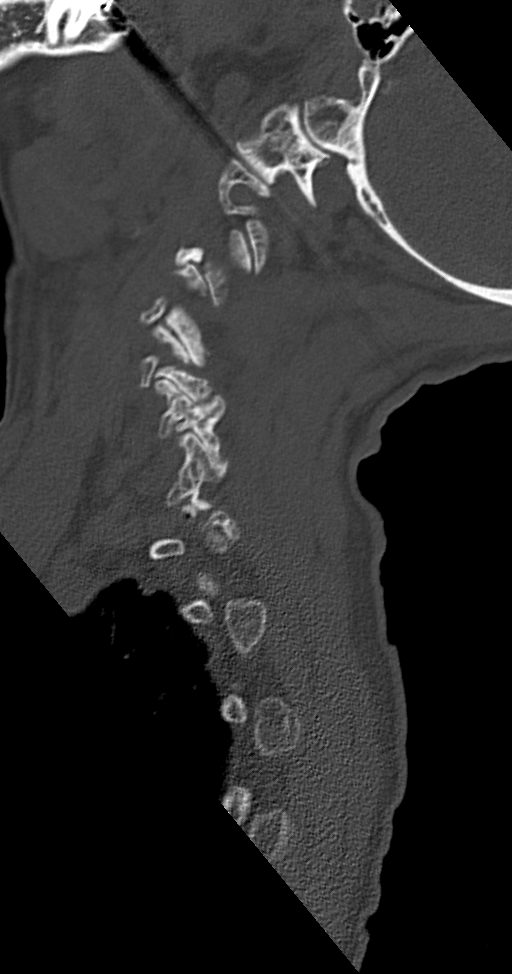

[11 of 27 positions shown; findings below may reference images not displayed]

FINDINGS: Alignment: Mildly exaggerated upper cervical lordosis.
Cervicothoracic junction alignment is within normal limits.
Bilateral posterior element alignment is within normal limits.

Skull base and vertebrae: Visualized skull base is intact. No
atlanto-occipital dissociation. C1 and C2 appear intact and aligned.
Osteopenia. No acute osseous abnormality identified.

Soft tissues and spinal canal: No prevertebral fluid or swelling. No
visible canal hematoma. Partially retropharyngeal course of the
right carotid. Mild for age calcified carotid atherosclerosis.
cm heterogeneous right thyroid nodule, stable since 2828 and In the
setting of significant comorbidities or limited life expectancy, no
follow-up recommended (ref: [HOSPITAL]. [DATE]):

Disc levels: Mild for age cervical spine degeneration. Capacious
cervical spinal canal. Multilevel left side facet arthropathy.

Upper chest: Grossly intact visible upper thoracic levels. Mild
apical lung scarring.
IMPRESSION: 1. No acute traumatic injury identified in the cervical spine.
2. Mild for age cervical spine degeneration.
